# Patient Record
Sex: Female | Born: 1940 | Race: White | Hispanic: No | Marital: Married | Smoking: Former smoker
Health system: Southern US, Community
[De-identification: ages and names within clinical notes are randomized; demographics above are authoritative.]

## PROBLEM LIST (undated history)

## (undated) DIAGNOSIS — M549 Dorsalgia, unspecified: Secondary | ICD-10-CM

## (undated) DIAGNOSIS — L309 Dermatitis, unspecified: Secondary | ICD-10-CM

## (undated) DIAGNOSIS — M51379 Other intervertebral disc degeneration, lumbosacral region without mention of lumbar back pain or lower extremity pain: Secondary | ICD-10-CM

## (undated) DIAGNOSIS — M5137 Other intervertebral disc degeneration, lumbosacral region: Secondary | ICD-10-CM

## (undated) DIAGNOSIS — D696 Thrombocytopenia, unspecified: Secondary | ICD-10-CM

## (undated) DIAGNOSIS — J984 Other disorders of lung: Secondary | ICD-10-CM

## (undated) HISTORY — DX: Other disorders of lung: J98.4

## (undated) HISTORY — DX: Dermatitis, unspecified: L30.9

## (undated) HISTORY — DX: Dorsalgia, unspecified: M54.9

## (undated) HISTORY — DX: Other intervertebral disc degeneration, lumbosacral region: M51.37

## (undated) HISTORY — DX: Other intervertebral disc degeneration, lumbosacral region without mention of lumbar back pain or lower extremity pain: M51.379

## (undated) HISTORY — DX: Thrombocytopenia, unspecified: D69.6

---

## 1975-07-30 HISTORY — PX: TUBAL LIGATION: SHX77

## 1982-07-29 HISTORY — PX: ABDOMINAL HYSTERECTOMY: SHX81

## 1988-07-29 HISTORY — PX: OTHER SURGICAL HISTORY: SHX169

## 2000-04-12 ENCOUNTER — Emergency Department (HOSPITAL_COMMUNITY): Admission: EM | Admit: 2000-04-12 | Discharge: 2000-04-12 | Payer: Self-pay | Admitting: Emergency Medicine

## 2005-07-29 HISTORY — PX: CHOLECYSTECTOMY: SHX55

## 2007-11-18 ENCOUNTER — Telehealth (INDEPENDENT_AMBULATORY_CARE_PROVIDER_SITE_OTHER): Payer: Self-pay | Admitting: *Deleted

## 2007-11-18 ENCOUNTER — Encounter: Admission: RE | Admit: 2007-11-18 | Discharge: 2007-11-18 | Payer: Self-pay | Admitting: Family Medicine

## 2007-11-18 ENCOUNTER — Ambulatory Visit: Payer: Self-pay | Admitting: Family Medicine

## 2007-11-18 DIAGNOSIS — E785 Hyperlipidemia, unspecified: Secondary | ICD-10-CM

## 2007-11-18 DIAGNOSIS — J4489 Other specified chronic obstructive pulmonary disease: Secondary | ICD-10-CM | POA: Insufficient documentation

## 2007-11-18 DIAGNOSIS — M810 Age-related osteoporosis without current pathological fracture: Secondary | ICD-10-CM | POA: Insufficient documentation

## 2007-11-18 DIAGNOSIS — M549 Dorsalgia, unspecified: Secondary | ICD-10-CM | POA: Insufficient documentation

## 2007-11-18 DIAGNOSIS — I10 Essential (primary) hypertension: Secondary | ICD-10-CM | POA: Insufficient documentation

## 2007-11-18 DIAGNOSIS — J449 Chronic obstructive pulmonary disease, unspecified: Secondary | ICD-10-CM

## 2007-11-18 DIAGNOSIS — M5137 Other intervertebral disc degeneration, lumbosacral region: Secondary | ICD-10-CM | POA: Insufficient documentation

## 2007-11-19 LAB — CONVERTED CEMR LAB
Albumin: 4.4 g/dL (ref 3.5–5.2)
Alkaline Phosphatase: 77 units/L (ref 39–117)
CO2: 21 meq/L (ref 19–32)
Calcium: 9.9 mg/dL (ref 8.4–10.5)
Chloride: 106 meq/L (ref 96–112)
Glucose, Bld: 104 mg/dL — ABNORMAL HIGH (ref 70–99)
HCT: 53.9 % — ABNORMAL HIGH (ref 36.0–46.0)
Hemoglobin: 17.4 g/dL — ABNORMAL HIGH (ref 12.0–15.0)
Total Bilirubin: 0.5 mg/dL (ref 0.3–1.2)
Total Protein: 7.8 g/dL (ref 6.0–8.3)
VLDL: 45 mg/dL — ABNORMAL HIGH (ref 0–40)
WBC: 10 10*3/uL (ref 4.0–10.5)

## 2007-11-25 ENCOUNTER — Encounter: Payer: Self-pay | Admitting: Family Medicine

## 2007-12-23 ENCOUNTER — Ambulatory Visit: Payer: Self-pay | Admitting: Family Medicine

## 2007-12-23 DIAGNOSIS — D696 Thrombocytopenia, unspecified: Secondary | ICD-10-CM | POA: Insufficient documentation

## 2007-12-24 ENCOUNTER — Encounter: Admission: RE | Admit: 2007-12-24 | Discharge: 2007-12-24 | Payer: Self-pay | Admitting: Family Medicine

## 2007-12-25 ENCOUNTER — Encounter: Payer: Self-pay | Admitting: Family Medicine

## 2007-12-25 LAB — CONVERTED CEMR LAB
HCT: 51.4 % — ABNORMAL HIGH (ref 36.0–46.0)
RBC: 5.36 M/uL — ABNORMAL HIGH (ref 3.87–5.11)
RDW: 15.1 % (ref 11.5–15.5)
WBC: 7.8 10*3/uL (ref 4.0–10.5)

## 2008-01-22 ENCOUNTER — Ambulatory Visit: Payer: Self-pay | Admitting: Family Medicine

## 2008-01-25 LAB — CONVERTED CEMR LAB
ALT: 11 units/L (ref 0–35)
AST: 19 units/L (ref 0–37)
Alkaline Phosphatase: 66 units/L (ref 39–117)
CO2: 23 meq/L (ref 19–32)
HDL: 43 mg/dL (ref >39)
Potassium: 4.2 meq/L (ref 3.5–5.3)
Sodium: 142 meq/L (ref 135–145)
Total CHOL/HDL Ratio: 4.7
Triglycerides: 193 mg/dL — ABNORMAL HIGH (ref <150)

## 2008-03-23 ENCOUNTER — Ambulatory Visit: Payer: Self-pay | Admitting: Family Medicine

## 2008-03-23 DIAGNOSIS — L259 Unspecified contact dermatitis, unspecified cause: Secondary | ICD-10-CM

## 2008-05-04 ENCOUNTER — Ambulatory Visit: Payer: Self-pay | Admitting: Family Medicine

## 2008-05-04 ENCOUNTER — Encounter: Admission: RE | Admit: 2008-05-04 | Discharge: 2008-05-04 | Payer: Self-pay | Admitting: Family Medicine

## 2008-05-04 DIAGNOSIS — R05 Cough: Secondary | ICD-10-CM | POA: Insufficient documentation

## 2008-05-05 LAB — CONVERTED CEMR LAB
Cholesterol: 137 mg/dL (ref 0–200)
LDL Cholesterol: 57 mg/dL (ref 0–99)
Triglycerides: 168 mg/dL — ABNORMAL HIGH (ref <150)

## 2008-07-04 ENCOUNTER — Ambulatory Visit: Payer: Self-pay | Admitting: Family Medicine

## 2008-09-05 ENCOUNTER — Ambulatory Visit: Payer: Self-pay | Admitting: Family Medicine

## 2008-12-06 ENCOUNTER — Ambulatory Visit: Payer: Self-pay | Admitting: Family Medicine

## 2008-12-07 LAB — CONVERTED CEMR LAB
ALT: 12 units/L (ref 0–35)
Albumin: 4.1 g/dL (ref 3.5–5.2)
BUN: 20 mg/dL (ref 6–23)
CO2: 22 meq/L (ref 19–32)
Calcium: 9.9 mg/dL (ref 8.4–10.5)
HDL: 49 mg/dL (ref >39)
LDL Cholesterol: 87 mg/dL (ref 0–99)
Sodium: 142 meq/L (ref 135–145)
Total CHOL/HDL Ratio: 3.6
Total Protein: 7 g/dL (ref 6.0–8.3)
VLDL: 41 mg/dL — ABNORMAL HIGH (ref 0–40)

## 2008-12-09 ENCOUNTER — Ambulatory Visit: Payer: Self-pay | Admitting: Family Medicine

## 2008-12-19 ENCOUNTER — Encounter: Admission: RE | Admit: 2008-12-19 | Discharge: 2008-12-19 | Payer: Self-pay | Admitting: Orthopedic Surgery

## 2008-12-24 ENCOUNTER — Encounter: Admission: RE | Admit: 2008-12-24 | Discharge: 2008-12-24 | Payer: Self-pay | Admitting: Orthopedic Surgery

## 2008-12-27 ENCOUNTER — Encounter: Admission: RE | Admit: 2008-12-27 | Discharge: 2008-12-27 | Payer: Self-pay | Admitting: Family Medicine

## 2009-02-07 ENCOUNTER — Telehealth: Payer: Self-pay | Admitting: Family Medicine

## 2009-02-14 ENCOUNTER — Telehealth: Payer: Self-pay | Admitting: Family Medicine

## 2009-03-03 ENCOUNTER — Ambulatory Visit: Payer: Self-pay | Admitting: Family Medicine

## 2009-03-29 ENCOUNTER — Inpatient Hospital Stay (HOSPITAL_COMMUNITY): Admission: RE | Admit: 2009-03-29 | Discharge: 2009-03-31 | Payer: Self-pay | Admitting: Orthopedic Surgery

## 2009-03-29 HISTORY — PX: BACK SURGERY: SHX140

## 2009-05-16 ENCOUNTER — Ambulatory Visit: Payer: Self-pay | Admitting: Family Medicine

## 2009-05-16 DIAGNOSIS — I872 Venous insufficiency (chronic) (peripheral): Secondary | ICD-10-CM | POA: Insufficient documentation

## 2009-05-17 ENCOUNTER — Encounter: Payer: Self-pay | Admitting: Family Medicine

## 2009-05-17 DIAGNOSIS — R944 Abnormal results of kidney function studies: Secondary | ICD-10-CM | POA: Insufficient documentation

## 2009-05-17 DIAGNOSIS — N183 Chronic kidney disease, stage 3 (moderate): Secondary | ICD-10-CM

## 2009-05-17 LAB — CONVERTED CEMR LAB
BUN: 20 mg/dL (ref 6–23)
Calcium: 9.7 mg/dL (ref 8.4–10.5)
Folate: 6 ng/mL
MCV: 100.2 fL — ABNORMAL HIGH (ref 78.0–100.0)
Potassium: 4.4 meq/L (ref 3.5–5.3)

## 2009-05-18 ENCOUNTER — Encounter: Payer: Self-pay | Admitting: Family Medicine

## 2009-05-18 ENCOUNTER — Encounter: Admission: RE | Admit: 2009-05-18 | Discharge: 2009-05-18 | Payer: Self-pay | Admitting: Family Medicine

## 2009-05-24 ENCOUNTER — Ambulatory Visit: Payer: Self-pay | Admitting: Vascular Surgery

## 2009-05-24 ENCOUNTER — Ambulatory Visit (HOSPITAL_COMMUNITY): Admission: RE | Admit: 2009-05-24 | Discharge: 2009-05-24 | Payer: Self-pay | Admitting: Family Medicine

## 2009-05-24 ENCOUNTER — Encounter: Payer: Self-pay | Admitting: Family Medicine

## 2009-05-26 ENCOUNTER — Encounter: Payer: Self-pay | Admitting: Family Medicine

## 2009-06-19 ENCOUNTER — Encounter: Admission: RE | Admit: 2009-06-19 | Discharge: 2009-06-19 | Payer: Self-pay | Admitting: Orthopedic Surgery

## 2009-06-27 ENCOUNTER — Ambulatory Visit: Payer: Self-pay | Admitting: Vascular Surgery

## 2009-06-27 ENCOUNTER — Encounter: Payer: Self-pay | Admitting: Family Medicine

## 2009-06-28 ENCOUNTER — Encounter: Admission: RE | Admit: 2009-06-28 | Discharge: 2009-07-27 | Payer: Self-pay | Admitting: Orthopedic Surgery

## 2009-07-12 ENCOUNTER — Ambulatory Visit: Payer: Self-pay | Admitting: Family Medicine

## 2009-08-16 ENCOUNTER — Ambulatory Visit: Payer: Self-pay | Admitting: Family Medicine

## 2009-08-28 ENCOUNTER — Encounter: Admission: RE | Admit: 2009-08-28 | Discharge: 2009-08-28 | Payer: Self-pay | Admitting: Orthopedic Surgery

## 2009-09-05 ENCOUNTER — Ambulatory Visit: Payer: Self-pay | Admitting: Family Medicine

## 2009-09-05 DIAGNOSIS — B029 Zoster without complications: Secondary | ICD-10-CM | POA: Insufficient documentation

## 2009-09-06 ENCOUNTER — Encounter: Payer: Self-pay | Admitting: Family Medicine

## 2009-09-08 ENCOUNTER — Encounter: Payer: Self-pay | Admitting: Family Medicine

## 2009-10-03 ENCOUNTER — Ambulatory Visit: Payer: Self-pay | Admitting: Family Medicine

## 2009-10-03 DIAGNOSIS — G4700 Insomnia, unspecified: Secondary | ICD-10-CM | POA: Insufficient documentation

## 2009-11-20 ENCOUNTER — Encounter: Admission: RE | Admit: 2009-11-20 | Discharge: 2009-11-20 | Payer: Self-pay | Admitting: Orthopedic Surgery

## 2010-02-10 IMAGING — CR DG LUMBAR SPINE 2-3V
2 series · 2 of 2 positions shown · non-contrast
Comparison: Intraoperative imaging of 1 day prior. preoperative
imaging of 03/27/2009.

CLINICAL DATA: Postop pain.  Postoperative  L5-S1 fixation.

LUMBAR SPINE - 2-3 VIEW

[t l-spine a.p.]
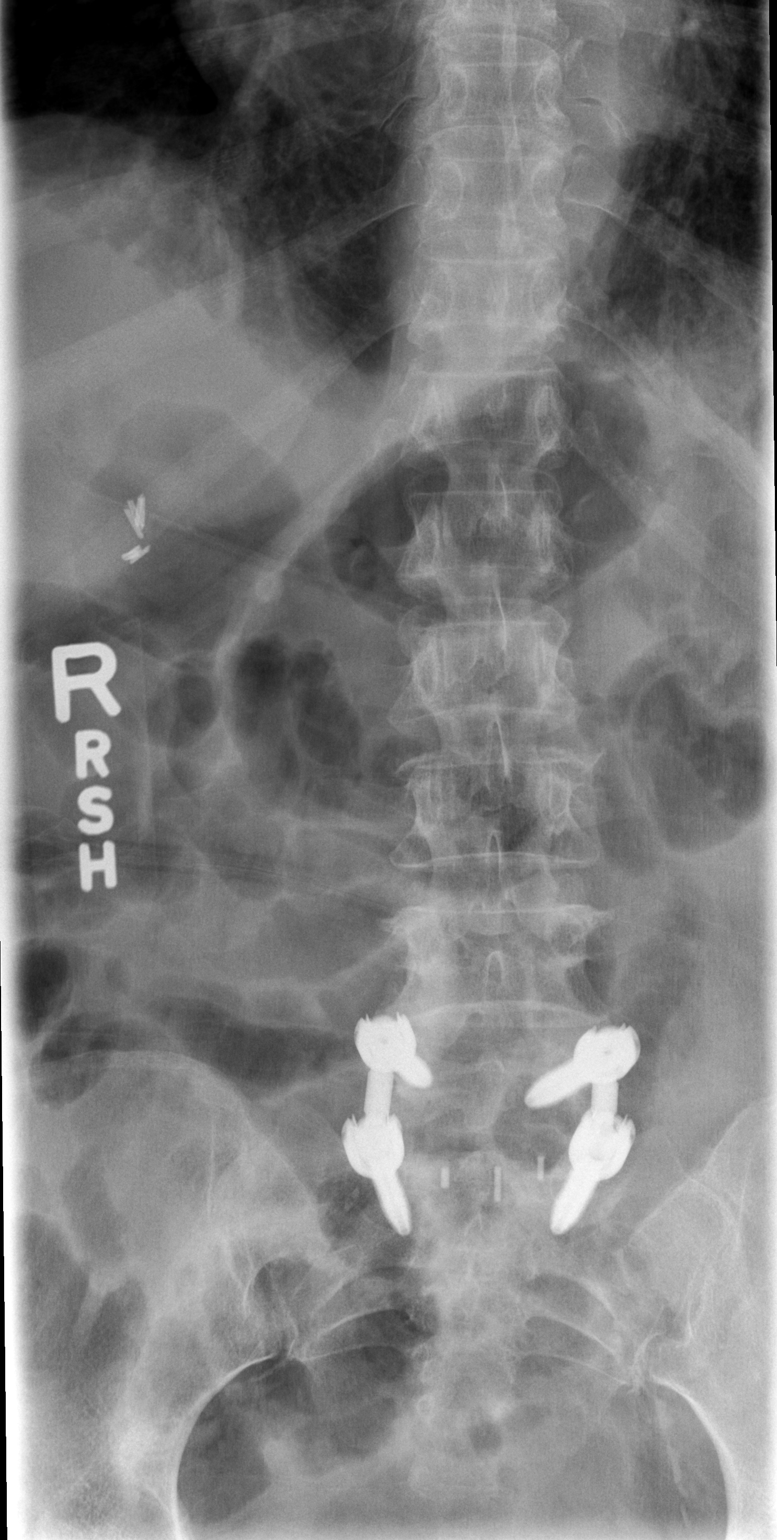

[t l-spine lat]
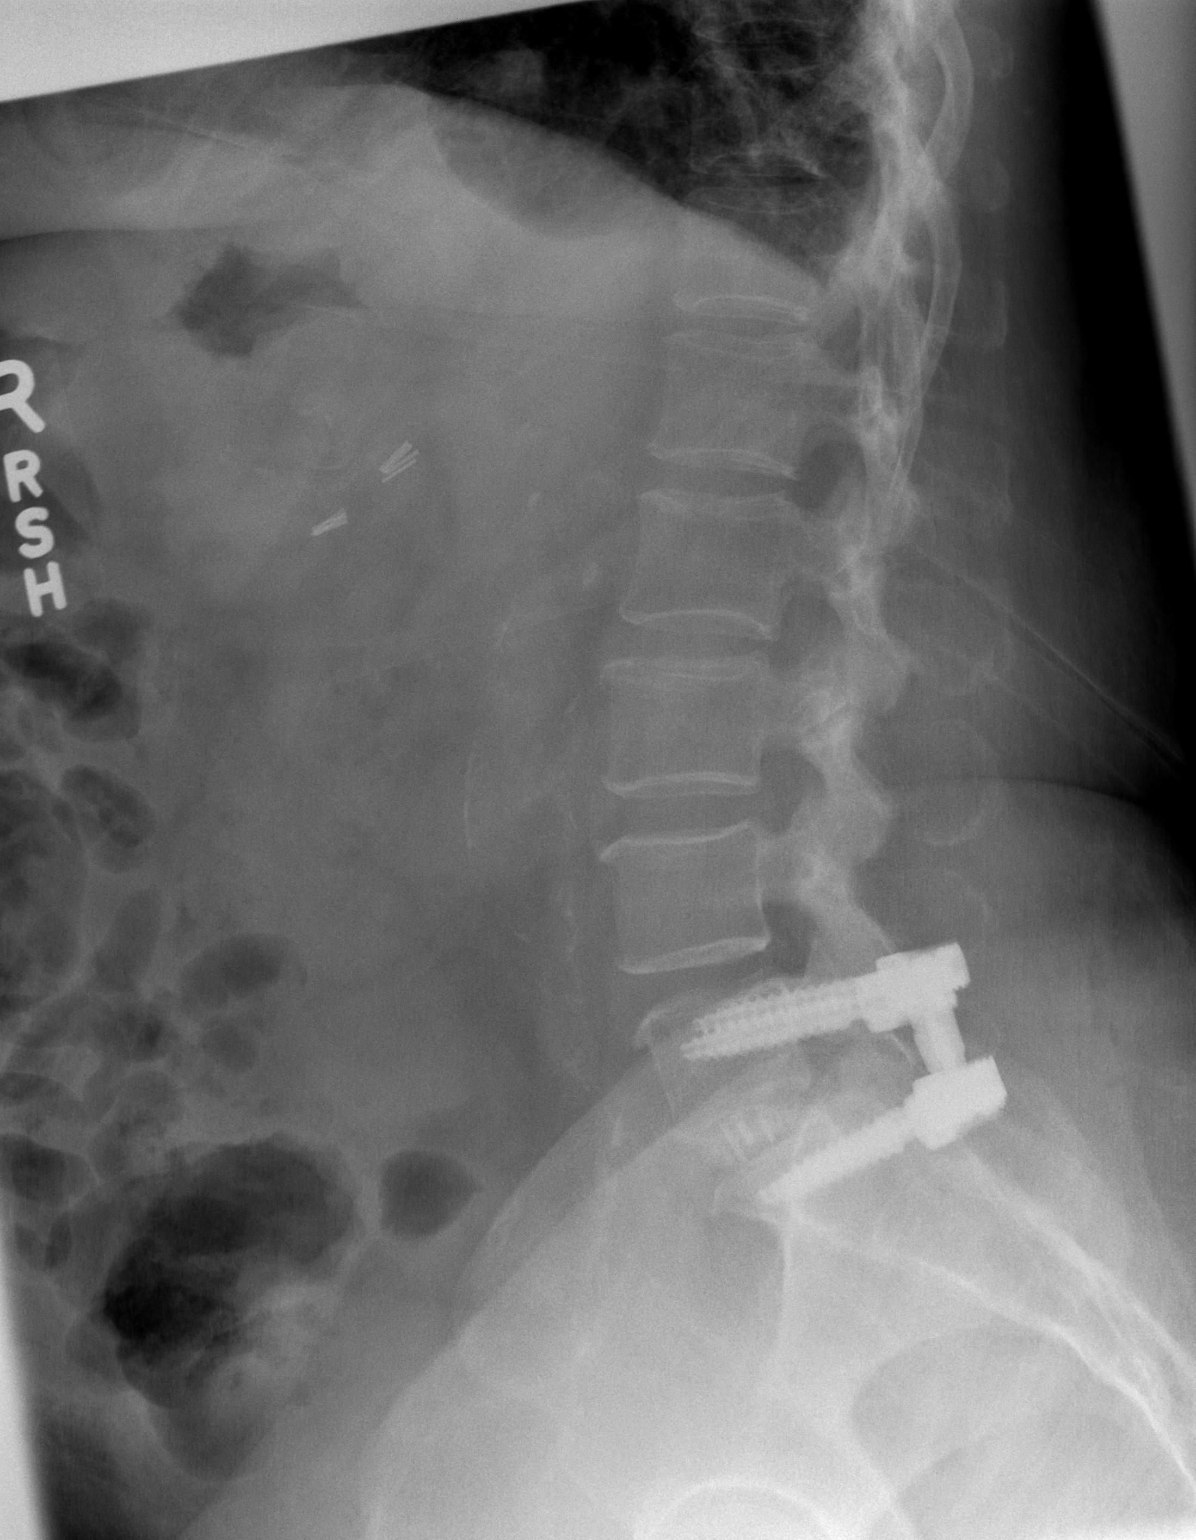

[2 of 2 positions shown; findings below may reference images not displayed]

FINDINGS: Five lumbar-type vertebral bodies.  Mildly prominent gas-
filled small bowel loops are suboptimally imaged.

The patient is status post trans pedicle rod and screw fixation at
L5-S1.  No acute hardware complication identified.

Remainder of vertebral body height is maintained.  Mild osteopenia.
Moderate aortic atherosclerosis incidentally noted.
IMPRESSION: 1.  Status post L5-S1 posterior fixation without acute
complication.
2.  Prominent gas-filled small bowel loops.  Question adynamic
ileus.  Suboptimally evaluated.

## 2010-04-09 ENCOUNTER — Encounter: Admission: RE | Admit: 2010-04-09 | Discharge: 2010-04-09 | Payer: Self-pay | Admitting: Orthopedic Surgery

## 2010-04-09 ENCOUNTER — Ambulatory Visit: Payer: Self-pay | Admitting: Family Medicine

## 2010-04-10 LAB — CONVERTED CEMR LAB
ALT: 10 units/L (ref 0–35)
AST: 11 units/L (ref 0–37)
Chloride: 104 meq/L (ref 96–112)
Creatinine, Ser: 1.07 mg/dL (ref 0.40–1.20)
Sodium: 142 meq/L (ref 135–145)
Total Bilirubin: 0.7 mg/dL (ref 0.3–1.2)
Total CHOL/HDL Ratio: 3.2
VLDL: 20 mg/dL (ref 0–40)

## 2010-04-25 ENCOUNTER — Encounter: Admission: RE | Admit: 2010-04-25 | Discharge: 2010-04-25 | Payer: Self-pay | Admitting: Orthopedic Surgery

## 2010-04-30 ENCOUNTER — Ambulatory Visit: Payer: Self-pay | Admitting: Family Medicine

## 2010-04-30 ENCOUNTER — Telehealth (INDEPENDENT_AMBULATORY_CARE_PROVIDER_SITE_OTHER): Payer: Self-pay | Admitting: *Deleted

## 2010-04-30 ENCOUNTER — Telehealth: Payer: Self-pay | Admitting: Family Medicine

## 2010-04-30 DIAGNOSIS — E119 Type 2 diabetes mellitus without complications: Secondary | ICD-10-CM

## 2010-04-30 LAB — CONVERTED CEMR LAB
Blood Glucose, AC Bkfst: 122 mg/dL
Hgb A1c MFr Bld: 6.5 %

## 2010-05-02 IMAGING — CR DG LUMBAR SPINE 2-3V
4 series · 4 of 4 positions shown · non-contrast
Comparison: 03/30/2009

CLINICAL DATA: Post lumbar fusion March 2009

LUMBAR SPINE - 2-3 VIEW

[view not recorded (1 of 4)]
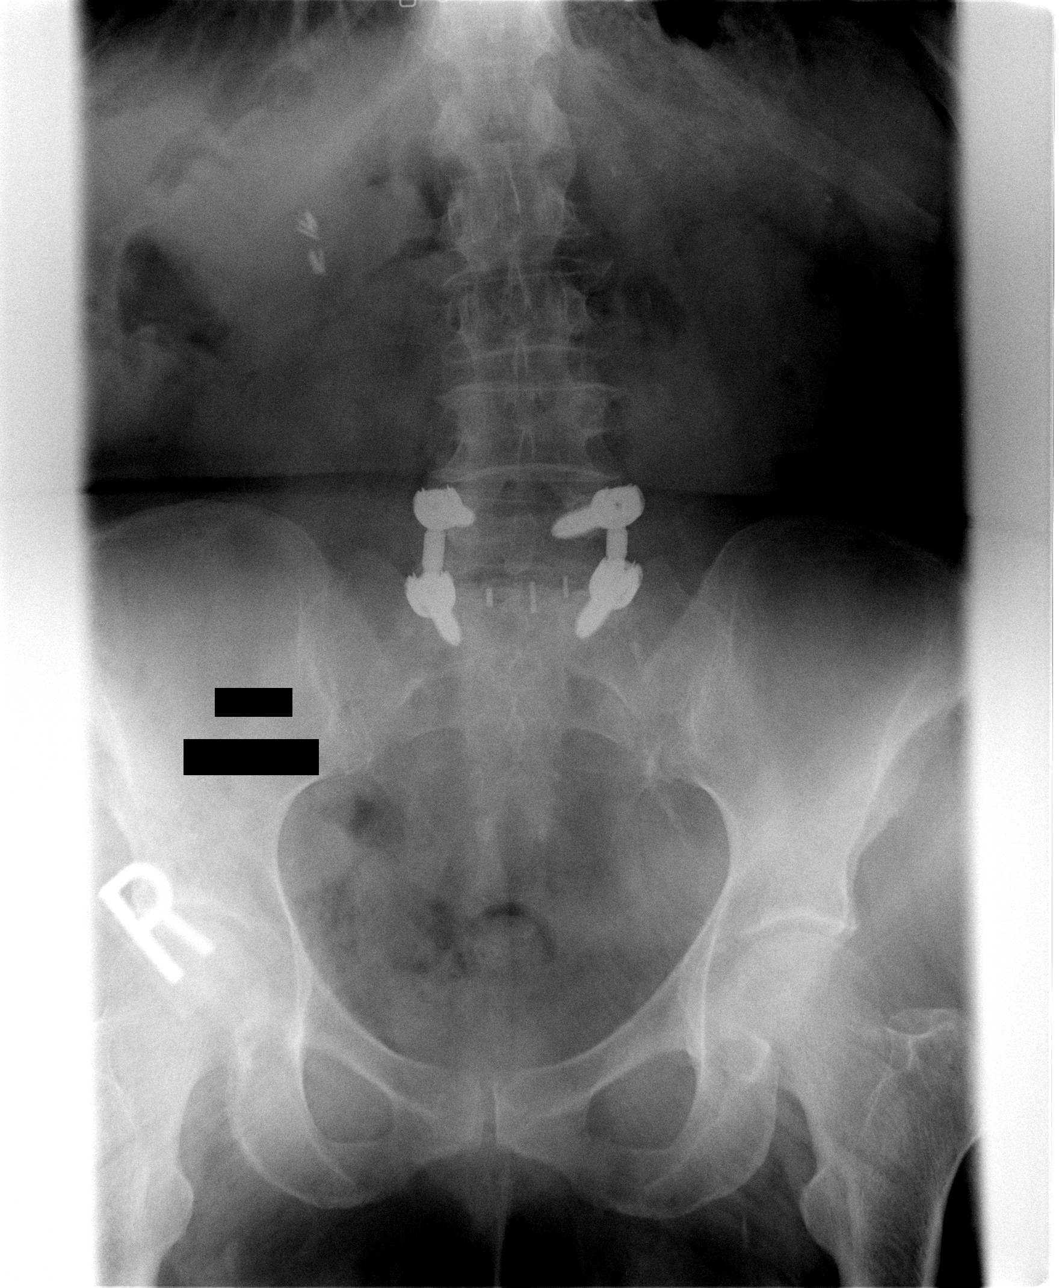

[view not recorded (2 of 4)]
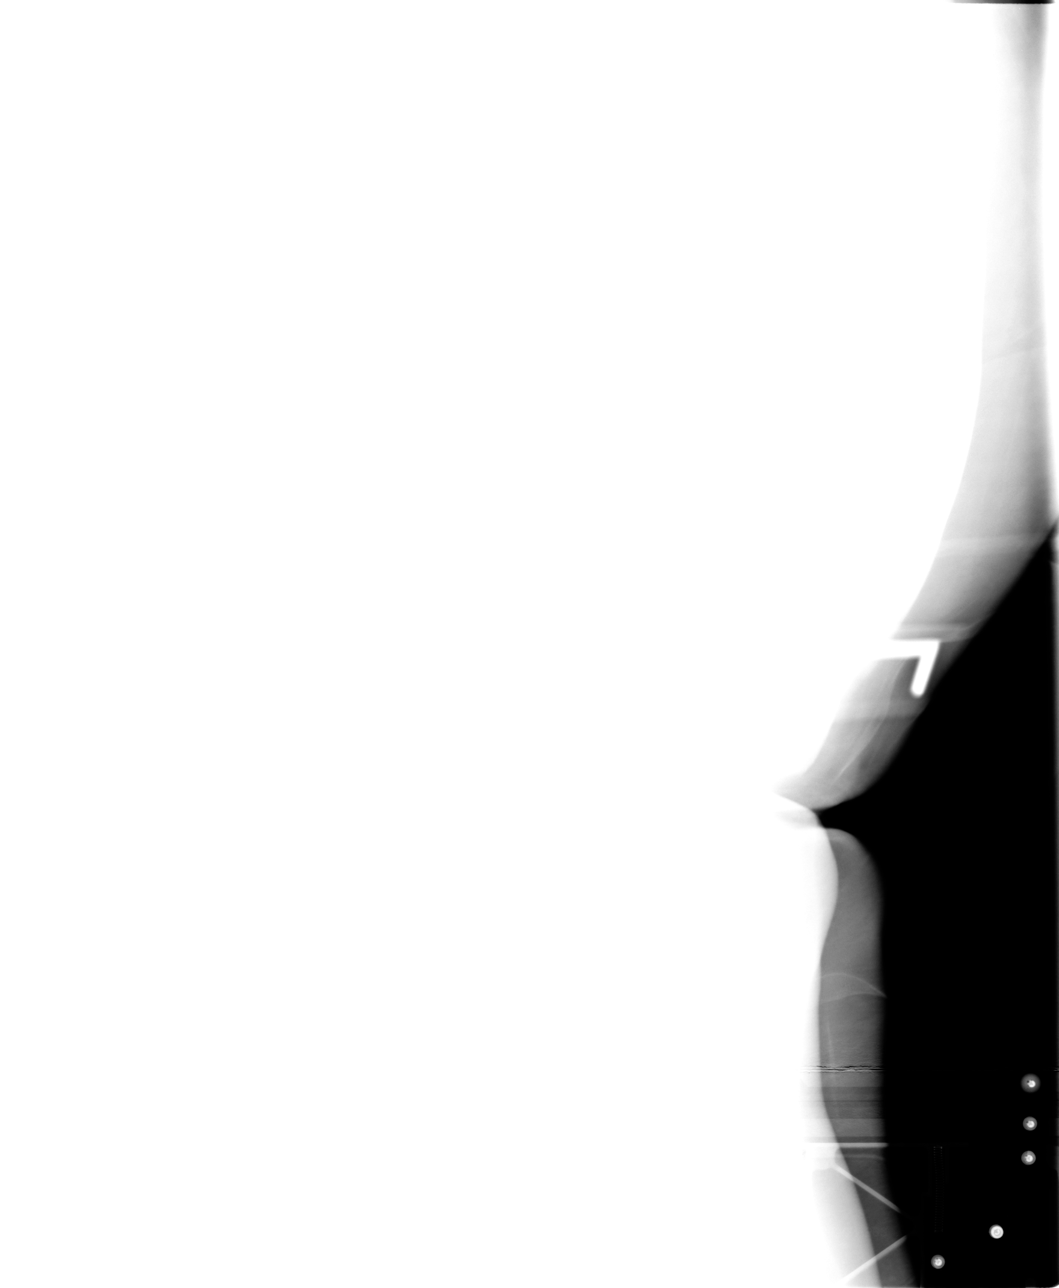

[view not recorded (3 of 4)]
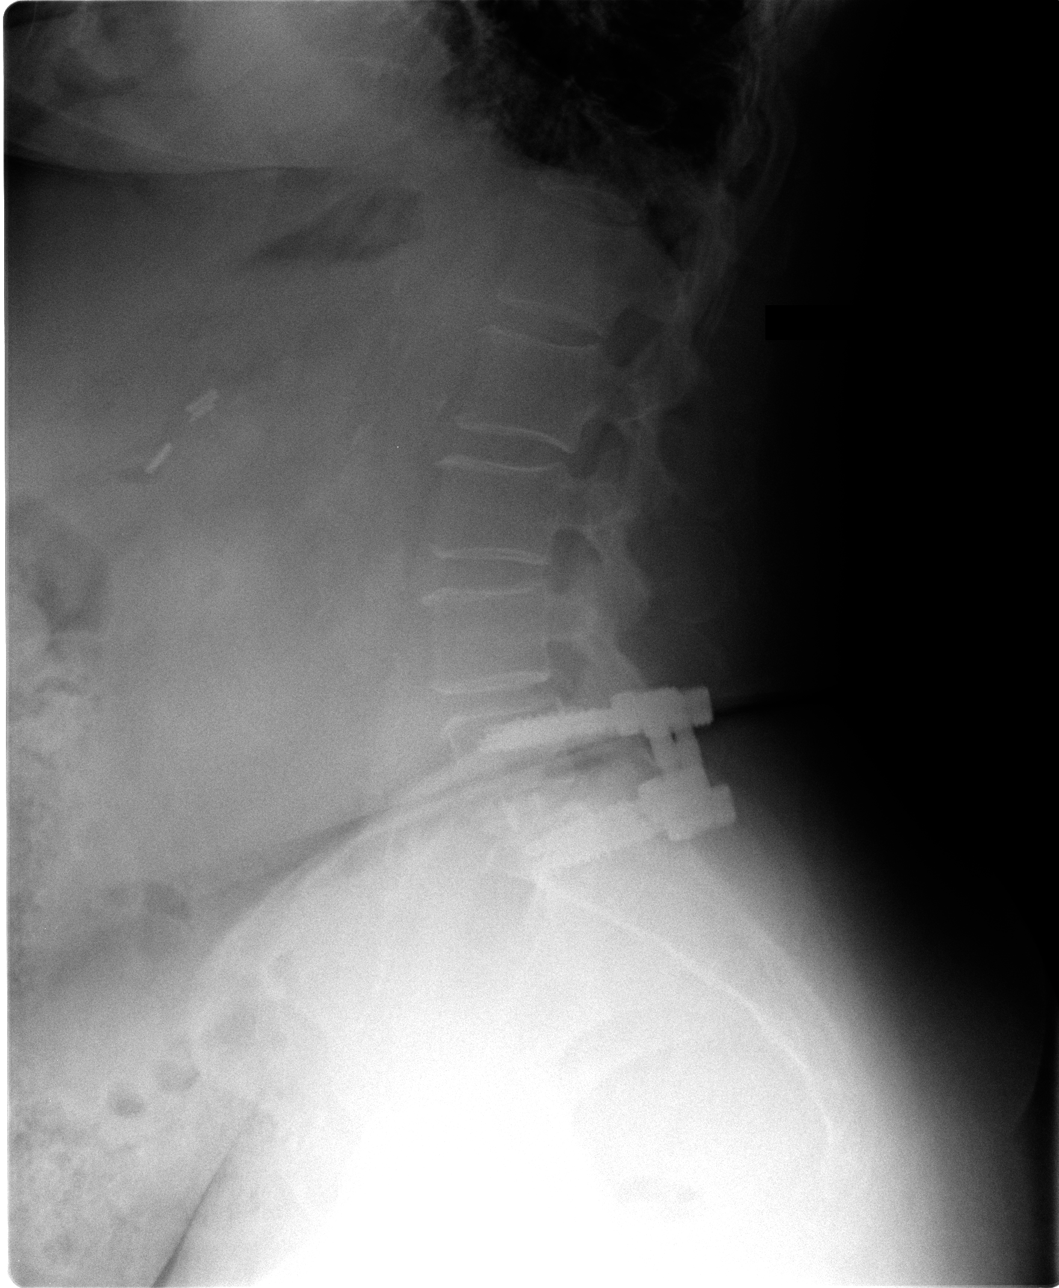

[view not recorded (4 of 4)]
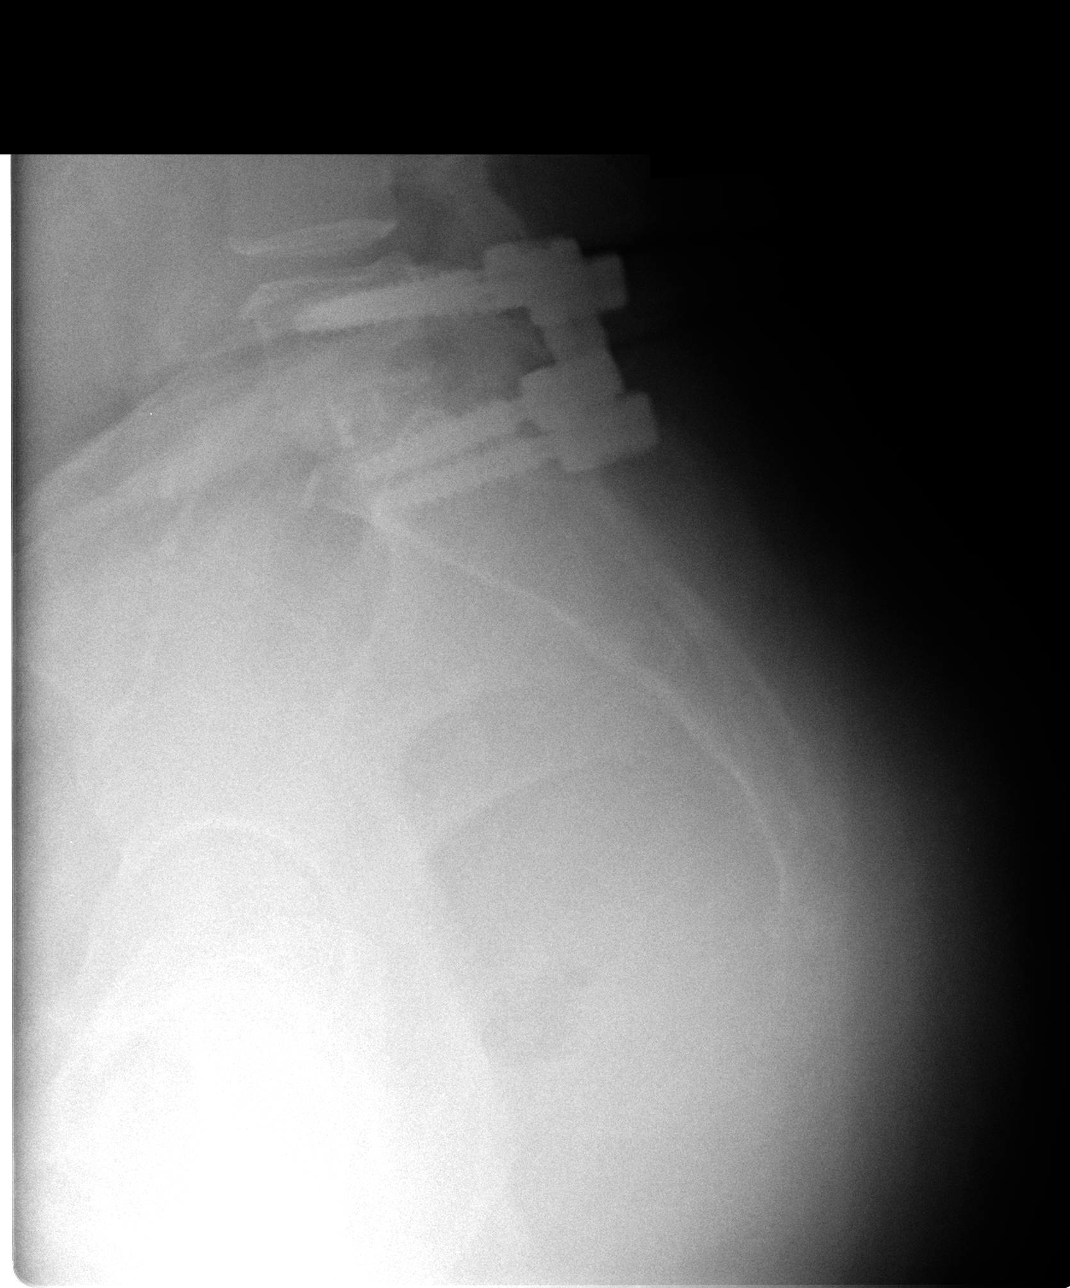

[4 of 4 positions shown; findings below may reference images not displayed]

FINDINGS: Status post trans pedicle rod and screw fixation with
discectomy at L5-S1.  No change in position or alignment of bony
elements and hardware.  Other vertebral bodies and disc space
height remain normal.
IMPRESSION: Good position alignment following L5-S1 fusion with discectomy.  No
acute findings or significant interval change.

## 2010-05-21 ENCOUNTER — Telehealth (INDEPENDENT_AMBULATORY_CARE_PROVIDER_SITE_OTHER): Payer: Self-pay | Admitting: *Deleted

## 2010-06-04 ENCOUNTER — Ambulatory Visit: Payer: Self-pay | Admitting: Family Medicine

## 2010-08-10 ENCOUNTER — Ambulatory Visit: Admit: 2010-08-10 | Payer: Self-pay | Admitting: Family Medicine

## 2010-08-26 LAB — CONVERTED CEMR LAB
AST: 17 units/L (ref 0–37)
Albumin: 4.1 g/dL (ref 3.5–5.2)
BUN: 24 mg/dL — ABNORMAL HIGH (ref 6–23)
Calcium, Total (PTH): 9 mg/dL (ref 8.4–10.5)
Calcium: 9.3 mg/dL (ref 8.4–10.5)
Cholesterol, target level: 200 mg/dL
Creatinine, Ser: 1.07 mg/dL (ref 0.40–1.20)
Creatinine, Ser: 1.13 mg/dL (ref 0.40–1.20)
Creatinine, Urine: 84.3 mg/dL
Glucose, Bld: 113 mg/dL — ABNORMAL HIGH (ref 70–99)
HCT: 48.6 % — ABNORMAL HIGH (ref 36.0–46.0)
HDL goal, serum: 40 mg/dL
Microalb Creat Ratio: 34.3 mg/g — ABNORMAL HIGH (ref 0.0–30.0)
Microalb Creat Ratio: 514.5 mg/g — ABNORMAL HIGH (ref 0.0–30.0)
Microalb, Ur: 2.89 mg/dL — ABNORMAL HIGH (ref 0.00–1.89)
PTH: 103.3 pg/mL — ABNORMAL HIGH (ref 14.0–72.0)
Platelets: 149 10*3/uL — ABNORMAL LOW (ref 150–400)
Sodium: 141 meq/L (ref 135–145)
Total Bilirubin: 0.6 mg/dL (ref 0.3–1.2)
Total Protein: 6.8 g/dL (ref 6.0–8.3)
WBC: 10.3 10*3/uL (ref 4.0–10.5)

## 2010-08-28 NOTE — Assessment & Plan Note (Signed)
Summary: New dx DM, HTN   Vital Signs:  Patient profile:   70 year old female Height:      63.1 inches Weight:      203 pounds Pulse rate:   88 / minute BP sitting:   145 / 83  (right arm) Cuff size:   large  Vitals Entered By: Avon Gully CMA, Duncan Dull) (April 30, 2010 9:16 AM) CC: discuss elevated BS   CC:  discuss elevated BS.  Current Medications (verified): 1)  Lisinopril-Hydrochlorothiazide 20-25 Mg  Tabs (Lisinopril-Hydrochlorothiazide) .... Take 1 Tablet By Mouth Once A Day 2)  Simvastatin 40 Mg  Tabs (Simvastatin) .... Take 1 Tablet By Mouth Once A Day At Bedtime 3)  Oxybutynin Chloride 5 Mg  Tb24 (Oxybutynin Chloride) .... Take 1 Tablet By Mouth Once A Day 4)  Proair Hfa 108 (90 Base) Mcg/act Aers (Albuterol Sulfate) .... 2 - 4 Puffs Inhaled Every 4-6 Hours As Needed Sob, Wheeze. 5)  Alendronate Sodium 70 Mg  Tabs (Alendronate Sodium) .... Take 1 Tablet By Mouth Once A Day 6)  Spiriva Handihaler 18 Mcg  Caps (Tiotropium Bromide Monohydrate) .... Inhale One Capsule Once Daily 7)  Bayer Childrens Aspirin 81 Mg Chew (Aspirin) .... Take 1 Tablet By Mouth Once A Day 8)  Advair Diskus 250-50 Mcg/dose Misc (Fluticasone-Salmeterol) .Marland Kitchen.. 1 Puff Two Times A Day Inhaled 9)  Fish Oil 1000 Mg Caps (Omega-3 Fatty Acids) .... Take 1 Tablet By Mouth Once A Day  Allergies (verified): No Known Drug Allergies  Comments:  Nurse/Medical Assistant: The patient's medications and allergies were reviewed with the patient and were updated in the Medication and Allergy Lists. Avon Gully CMA, Duncan Dull) (April 30, 2010 9:17 AM)  Family History: Reviewed history from 12/09/2008 and no changes required. FAther died CHF Brother with DM Multiple sibling wiht DM.    Social History: Reviewed history from 03/03/2009 and no changes required. Homemaker.  HS degree.   Married to [employer] (MI x 2 , stroke, DM)  with 3 adult children.   Current Smoker- 3/4 ppd, down from 1ppd Alcohol  use-no Drug use-no Regular exercise-no  Physical Exam  General:  Well-developed,well-nourished,in no acute distress; alert,appropriate and cooperative throughout examination Head:  Normocephalic and atraumatic without obvious abnormalities. No apparent alopecia or balding. Neck:  No deformities, masses, or tenderness noted. NO tM.  Lungs:  Normal respiratory effort, chest expands symmetrically. Lungs are clear to auscultation, no crackles or wheezes. Heart:  Normal rate and regular rhythm. S1 and S2 normal without gallop, murmur, click, rub or other extra sounds. No carotid bruits.  Pulses:  Radial 2+ bilat.  Neurologic:  No cranial nerve deficits noted. Station and gait are normal. Plantar reflexes are down-going bilaterally. DTRs are symmetrical throughout. Sensory, motor and coordinative functions appear intact. Skin:  no rashes.   Cervical Nodes:  No lymphadenopathy noted Psych:  Cognition and judgment appear intact. Alert and cooperative with normal attention span and concentration. No apparent delusions, illusions, hallucinations   Impression & Recommendations:  Problem # 1:  DIABETES MELLITUS, CONTROLLED (ICD-250.00) Assessment New  Dsicussed new dx. A1C is 6.5.  Will owrk on diet, and weight loss. She has chronic back pain ans has difficulty exercising to discussede working out her upper body and this will help. Given rx for meter and strips. CHeck sugar 2 x a week fasting Given 4 H.O on diet and exercise and diabetes.  Will refer for nutritition classes F/U in one month.She has had her flu vaccine.  Her updated medication list for this problem includes:    Lisinopril-hydrochlorothiazide 20-25 Mg Tabs (Lisinopril-hydrochlorothiazide) .Marland Kitchen... Take 1 tablet by mouth once a day    Bayer Childrens Aspirin 81 Mg Chew (Aspirin) .Marland Kitchen... Take 1 tablet by mouth once a day  Orders: Fingerstick ([address]109) Glucose, (CBG) (82962) Hgb A1C (60454UJ) Nutrition Referral (Nutrition) Prescription  Created Electronically [phone])  Problem # 2:  ESSENTIAL HYPERTENSION, BENIGN (ICD-401.1) Assessment: Improved  Improved but not at goal. Has lost a few pounds. Continue to work on weight loss.  Will add a low dose betablocker for better control.   The following medications were removed from the medication list:    Furosemide 40 Mg Tabs (Furosemide) .Marland Kitchen... Take 1 tablet by mouth once a day as needed for swelling Her updated medication list for this problem includes:    Lisinopril-hydrochlorothiazide 20-25 Mg Tabs (Lisinopril-hydrochlorothiazide) .Marland Kitchen... Take 1 tablet by mouth once a day    Toprol Xl 25 Mg Xr24h-tab (Metoprolol succinate) .Marland Kitchen... Take 1 tablet by mouth once a day  BP today: 145/83 Prior BP: 157/86 (04/09/2010)  Prior 10 Yr Risk Heart Disease: 15 % (04/09/2010)  Labs Reviewed: K+: 5.0 (04/09/2010) Creat: : 1.07 (04/09/2010)   Chol: 167 (04/09/2010)   HDL: 53 (04/09/2010)   LDL: 94 (04/09/2010)   TG: 102 (04/09/2010)  Complete Medication List: 1)  Lisinopril-hydrochlorothiazide 20-25 Mg Tabs (Lisinopril-hydrochlorothiazide) .... Take 1 tablet by mouth once a day 2)  Simvastatin 40 Mg Tabs (Simvastatin) .... Take 1 tablet by mouth once a day at bedtime 3)  Oxybutynin Chloride 5 Mg Tb24 (Oxybutynin chloride) .... Take 1 tablet by mouth once a day 4)  Proair Hfa 108 (90 Base) Mcg/act Aers (Albuterol sulfate) .... 2 - 4 puffs inhaled every 4-6 hours as needed sob, wheeze. 5)  Alendronate Sodium 70 Mg Tabs (Alendronate sodium) .... Take 1 tablet by mouth once a day 6)  Spiriva Handihaler 18 Mcg Caps (Tiotropium bromide monohydrate) .... Inhale one capsule once daily 7)  Bayer Childrens Aspirin 81 Mg Chew (Aspirin) .... Take 1 tablet by mouth once a day 8)  Advair Diskus 250-50 Mcg/dose Misc (Fluticasone-salmeterol) .Marland Kitchen.. 1 puff two times a day inhaled 9)  Fish Oil 1000 Mg Caps (Omega-3 fatty acids) .... Take 1 tablet by mouth once a day 10)  Toprol Xl 25 Mg Xr24h-tab (Metoprolol  succinate) .... Take 1 tablet by mouth once a day 11)  Glucometer  .... Test dialy. stips and lancets for 90 days supply dx 250.00  Patient Instructions: 1)  Check sugars in the AM before breakfast 2 days a week and follow up in one month to go over sugars and adjust care plan.  Prescriptions: GLUCOMETER TEst dialy. STips and lancets for 90 days supply Dx 250.00  #90 day sup x 1   Entered and Authorized by:   [name]   Signed by:   [name] on 04/30/2010   Method used:   Printed then faxed to ...       [facility] (retail)       [address]       [place], [place]  [address]       Ph: [phone]       Fax: [phone]   RxID:   [id number] TOPROL XL 25 MG XR24H-TAB (METOPROLOL SUCCINATE) Take 1 tablet by mouth once a day  #30 x 2   Entered and Authorized by:   [name]   Signed by:   [name] on 04/30/2010  Method used:   Electronically to        [facility] (retail)       [address]       [place], [place]  [address]       Ph: [phone]       Fax: [phone]   RxID:   [id number]   Laboratory Results   Blood Tests   Date/Time Received: 04/30/10 Date/Time Reported: 04/30/10  HGBA1C: 6.5%   (Normal Range: Non-Diabetic - 3-6%   Control Diabetic - 6-8%) CBG Fasting:: 122mg /dL

## 2010-08-28 NOTE — Progress Notes (Signed)
----   Converted from flag ---- ---- 04/30/2010 11:59 AM, [name] wrote: Call pt: Let he know I did send over a new rx for BP so now on 2 tabs for BP. ------------------------------ 04/30/10 12:56 called pt and notified her of above instructions. Pt voiced understanding. acm

## 2010-08-28 NOTE — Assessment & Plan Note (Signed)
Summary: shingles   Vital Signs:  Patient profile:   70 year old female Height:      63.1 inches Weight:      197 pounds BMI:     34.91 Pulse rate:   103 / minute BP sitting:   149 / 86  Vitals Entered By: Kandice Hams (September 05, 2009 10:04 AM) CC: C/O RASH ON BACK   Primary Care Provider:  Linford Arnold, C  CC:  C/O RASH ON BACK.  History of Present Illness: Rash started last week, ithcy, burns and painful.  No creams or medication.  Getting worse this week. STarted out as a small spot.  No fever or URI sxs. No worsenign or alleviating sxs. Has woken her up the last 2 nights. Think has shingles years ago on her shoulder.   Allergies: No Known Drug Allergies  Physical Exam  General:  Well-developed,well-nourished,in no acute distress; alert,appropriate and cooperative throughout examination Skin:  RAsh on the left midf flank that wraps from teh spine to the naval.  Lesions are vessicular and purples on an erythematous base. Lesoin on her side are clear vesicles nad this is where the culture was taken.    Impression & Recommendations:  Problem # 1:  SHINGLES (ICD-053.9) Discussed dx. Culture done to confrim. Will treat with famciclovir and topical lidocaine gel.  She already has some hydrocodone at home rx by another physican and can use this abedtime if pain is keeping her awake. Avoid scratching to avoid infection.  Orders: T-Herpes Culture (Routine) ([id number])  Complete Medication List: 1)  Lisinopril-hydrochlorothiazide 20-25 Mg Tabs (Lisinopril-hydrochlorothiazide) .... Take 1 tablet by mouth once a day 2)  Simvastatin 40 Mg Tabs (Simvastatin) .... Take 1 tablet by mouth once a day at bedtime 3)  Oxybutynin Chloride 5 Mg Tb24 (Oxybutynin chloride) .... Take 1 tablet by mouth once a day 4)  Proair Hfa 108 (90 Base) Mcg/act Aers (Albuterol sulfate) .... 2 - 4 puffs inhaled every 4-6 hours as needed sob, wheeze. 5)  Alendronate Sodium 70 Mg Tabs (Alendronate sodium) ....  Take 1 tablet by mouth once a day 6)  Spiriva Handihaler 18 Mcg Caps (Tiotropium bromide monohydrate) .... Inhale one capsule once daily 7)  Bayer Childrens Aspirin 81 Mg Chew (Aspirin) .... Take 1 tablet by mouth once a day 8)  Advair Diskus 250-50 Mcg/dose Misc (Fluticasone-salmeterol) .Marland Kitchen.. 1 puff two times a day inhaled 9)  Fish Oil 1000 Mg Caps (Omega-3 fatty acids) .... Take 1 tablet by mouth once a day 10)  Furosemide 40 Mg Tabs (Furosemide) .... Take 1 tablet by mouth once a day as needed for swelling 11)  Medium Grade Below The Knee Compression Stockings.  .... Please measure  and fit for the right and leg left. 12)  Lidocaine Hcl 2 % Gel (Lidocaine hcl) .... Apply four times a day as needed for rash pain. 13)  Famciclovir 500 Mg Tabs (Famciclovir) .... Take 1 tablet by mouth three times a day for 7 days  Patient Instructions: 1)  Please schedule a follow-up appointment in 1 month to recheck Blood pressure.  Prescriptions: ADVAIR DISKUS 250-50 MCG/DOSE MISC (FLUTICASONE-SALMETEROL) 1 puff two times a day inhaled  #1 x 5   Entered and Authorized by:   [name]   Signed by:   [name] on 09/05/2009   Method used:   Electronically to        [facility] (retail)       [address]  [place], [place]  [address]       Ph: [phone]       Fax: [phone]   RxID:   [id number] SPIRIVA HANDIHALER 18 MCG  CAPS (TIOTROPIUM BROMIDE MONOHYDRATE) Inhale one capsule once daily  #30 Each x 11   Entered and Authorized by:   [name]   Signed by:   [name] on 09/05/2009   Method used:   Electronically to        [facility]* (retail)       [address]       [place], [place]  [address]       Ph: [phone]       Fax: [phone]   RxID:   [id number] ALENDRONATE SODIUM 70 MG  TABS (ALENDRONATE SODIUM) Take 1 tablet by mouth once a day  #30 x 11   Entered and Authorized by:   [name]   Signed by:   [name]  on 09/05/2009   Method used:   Electronically to        [facility] (retail)       [address]       [place], [place]  [address]       Ph: [phone]       Fax: [phone]   RxID:   [id number] PROAIR HFA 108 (90 BASE) MCG/ACT AERS (ALBUTEROL SULFATE) 2 - 4 puffs inhaled every 4-6 hours as needed SOB, wheeze.  #1 x 3   Entered and Authorized by:   [name]   Signed by:   [name] on 09/05/2009   Method used:   Electronically to        [facility] (retail)       [address]       [place], [place]  [address]       Ph: [phone]       Fax: [phone]   RxID:   [id number] SIMVASTATIN 40 MG  TABS (SIMVASTATIN) Take 1 tablet by mouth once a day at bedtime  #30 x 11   Entered and Authorized by:   [name]   Signed by:   [name] on 09/05/2009   Method used:   Electronically to        [facility]* (retail)       [address]       [place], [place]  [address]       Ph: [phone]       Fax: [phone]   RxID:   [id number] LISINOPRIL-HYDROCHLOROTHIAZIDE 20-25 MG  TABS (LISINOPRIL-HYDROCHLOROTHIAZIDE) Take 1 tablet by mouth once a day  #30 Each x 11   Entered and Authorized by:   [name]   Signed by:   [name] on 09/05/2009   Method used:   Electronically to        [facility] (retail)       [address]       [place], [place]  [address]       Ph: [phone]       Fax: [phone]   RxID:   [id number] FAMCICLOVIR 500 MG TABS (FAMCICLOVIR) Take 1 tablet by mouth three times a day for 7 days  #21 x 0   Entered and Authorized by:   [name]   Signed by:   [name] on 09/05/2009   Method used:   Electronically to        [facility] (retail)       [address]  [place], [place]  [address]       Ph: [phone]       Fax: [phone]   RxID:   [id number] LIDOCAINE HCL 2 % GEL (LIDOCAINE HCL) Apply four times a day as needed for rash pain.  #1 tube x 0   Entered  and Authorized by:   [name]   Signed by:   [name] on 09/05/2009   Method used:   Electronically to        [facility] (retail)       [address]       [place], [place]  [address]       Ph: [phone]       Fax: [phone]   RxID:   [id number]

## 2010-08-28 NOTE — Assessment & Plan Note (Signed)
Summary: 6 MONTH FU HTN, lipids   Vital Signs:  Patient profile:   70 year old female Height:      63.1 inches Weight:      205 pounds Pulse rate:   95 / minute BP sitting:   157 / 86  (right arm) Cuff size:   large  Vitals Entered By: Avon Gully CMA, (AAMA) (April 09, 2010 10:30 AM)  Serial Vital Signs/Assessments:  Time      Position  BP       Pulse  Resp  Temp     By 10:42 AM            130/78                         [name]  CC: f/u BP, Hypertension Management   Primary Care Provider:  Linford Arnold, C  CC:  f/u BP and Hypertension Management.  History of Present Illness: Having occ dizzy spells and feels her balance is off. No syncope or near syncope. No HA or vision changes. No swelling recnelty. Has been stable. Occ will notice it.  No exacerbating or alleviating factors.    Hypertension History:      She notes no problems with any antihypertensive medication side effects.        Positive major cardiovascular risk factors include female age 22 years old or older, hyperlipidemia, hypertension, and current tobacco user.     Current Medications (verified): 1)  Lisinopril-Hydrochlorothiazide 20-25 Mg  Tabs (Lisinopril-Hydrochlorothiazide) .... Take 1 Tablet By Mouth Once A Day 2)  Simvastatin 40 Mg  Tabs (Simvastatin) .... Take 1 Tablet By Mouth Once A Day At Bedtime 3)  Oxybutynin Chloride 5 Mg  Tb24 (Oxybutynin Chloride) .... Take 1 Tablet By Mouth Once A Day 4)  Proair Hfa 108 (90 Base) Mcg/act Aers (Albuterol Sulfate) .... 2 - 4 Puffs Inhaled Every 4-6 Hours As Needed Sob, Wheeze. 5)  Alendronate Sodium 70 Mg  Tabs (Alendronate Sodium) .... Take 1 Tablet By Mouth Once A Day 6)  Spiriva Handihaler 18 Mcg  Caps (Tiotropium Bromide Monohydrate) .... Inhale One Capsule Once Daily 7)  Bayer Childrens Aspirin 81 Mg Chew (Aspirin) .... Take 1 Tablet By Mouth Once A Day 8)  Advair Diskus 250-50 Mcg/dose Misc (Fluticasone-Salmeterol) .Marland Kitchen.. 1 Puff Two Times A  Day Inhaled 9)  Fish Oil 1000 Mg Caps (Omega-3 Fatty Acids) .... Take 1 Tablet By Mouth Once A Day 10)  Furosemide 40 Mg Tabs (Furosemide) .... Take 1 Tablet By Mouth Once A Day As Needed For Swelling 11)  Trazodone Hcl 50 Mg Tabs (Trazodone Hcl) .... Take 1-2  Tablet By Mouth Once A Day At Bedtime  Allergies (verified): No Known Drug Allergies  Physical Exam  General:  Well-developed,well-nourished,in no acute distress; alert,appropriate and cooperative throughout examination Lungs:  Normal respiratory effort, chest expands symmetrically. Lungs are clear to auscultation, no crackles or wheezes. Heart:  Normal rate and regular rhythm. S1 and S2 normal without gallop, murmur, click, rub or other extra sounds. Extremities:  NO LE edema.  Skin:  no rashes.   Psych:  Cognition and judgment appear intact. Alert and cooperative with normal attention span and concentration. No apparent delusions, illusions, hallucinations   Impression & Recommendations:  Problem # 1:  ESSENTIAL HYPERTENSION, BENIGN (ICD-401.1)  Repeat BP is at goal.  Looks OK. Will get labs today.  Continue current regimen. Only using lasix occassionally.  Her updated medication list for  this problem includes:    Lisinopril-hydrochlorothiazide 20-25 Mg Tabs (Lisinopril-hydrochlorothiazide) .Marland Kitchen... Take 1 tablet by mouth once a day    Furosemide 40 Mg Tabs (Furosemide) .Marland Kitchen... Take 1 tablet by mouth once a day as needed for swelling  BP today: 157/86 Prior BP: 128/77 (10/03/2009)  Prior 10 Yr Risk Heart Disease: 11 % (10/03/2009)  Labs Reviewed: K+: 4.4 (05/16/2009) Creat: : 1.07 (07/12/2009)   Chol: 177 (12/06/2008)   HDL: 49 (12/06/2008)   LDL: 87 (12/06/2008)   TG: 204 (12/06/2008)  Problem # 2:  HYPERLIPIDEMIA (ICD-272.4) Due to check labs.  Her updated medication list for this problem includes:    Simvastatin 40 Mg Tabs (Simvastatin) .Marland Kitchen... Take 1 tablet by mouth once a day at bedtime  Labs Reviewed: SGOT: 17  (03/03/2009)   SGPT: 21 (03/03/2009)  Lipid Goals: Chol Goal: 200 (03/03/2009)   HDL Goal: 40 (03/03/2009)   LDL Goal: 130 (03/03/2009)   TG Goal: 150 (03/03/2009)  Prior 10 Yr Risk Heart Disease: 11 % (10/03/2009)   HDL:49 (12/06/2008), 46 (05/04/2008)  LDL:87 (12/06/2008), 57 (05/04/2008)  Chol:177 (12/06/2008), 137 (05/04/2008)  Trig:204 (12/06/2008), 168 (05/04/2008)  Complete Medication List: 1)  Lisinopril-hydrochlorothiazide 20-25 Mg Tabs (Lisinopril-hydrochlorothiazide) .... Take 1 tablet by mouth once a day 2)  Simvastatin 40 Mg Tabs (Simvastatin) .... Take 1 tablet by mouth once a day at bedtime 3)  Oxybutynin Chloride 5 Mg Tb24 (Oxybutynin chloride) .... Take 1 tablet by mouth once a day 4)  Proair Hfa 108 (90 Base) Mcg/act Aers (Albuterol sulfate) .... 2 - 4 puffs inhaled every 4-6 hours as needed sob, wheeze. 5)  Alendronate Sodium 70 Mg Tabs (Alendronate sodium) .... Take 1 tablet by mouth once a day 6)  Spiriva Handihaler 18 Mcg Caps (Tiotropium bromide monohydrate) .... Inhale one capsule once daily 7)  Bayer Childrens Aspirin 81 Mg Chew (Aspirin) .... Take 1 tablet by mouth once a day 8)  Advair Diskus 250-50 Mcg/dose Misc (Fluticasone-salmeterol) .Marland Kitchen.. 1 puff two times a day inhaled 9)  Fish Oil 1000 Mg Caps (Omega-3 fatty acids) .... Take 1 tablet by mouth once a day 10)  Furosemide 40 Mg Tabs (Furosemide) .... Take 1 tablet by mouth once a day as needed for swelling  Other Orders: Zoster (Shingles) Vaccine Live [phone]) Admin 1st Vaccine ([address]454) Flu Vaccine 48yrs + (09811) Admin of Any Addtl Vaccine (91478)  Hypertension Assessment/Plan:      The patient's hypertensive risk group is category B: At least one risk factor (excluding diabetes) with no target organ damage.  Her calculated 10 year risk of coronary heart disease is 15 %.  Today's blood pressure is 157/86.    Patient Instructions: 1)  Schedule breathing test for you COPD when convenient.  2)  Given flu shot  today.  3)  Please schedule a follow-up appointment in 4 months for blood pressure .      Immunizations Administered:  Zostavax # 1:    Vaccine Type: Zostavax    Site: right deltoid    Mfr: Merck    Dose: 0.5 ml    Route: IM    Given by: Sue Lush McCrimmon CMA, (AAMA)    Exp. Date: 03/02/2011    Lot #: [id number]    VIS given: 05/10/05 given April 09, 2010.  Influenza Vaccine # 1:    Vaccine Type: Fluvax 3+    Site: left buttock    Mfr: fluarix    Dose: 0.5 ml    Route: IM    Given by:  Sue Lush McCrimmon CMA, (AAMA)    Exp. Date: 01/26/2011    Lot #: [id number]    VIS given: 02/20/10 version given April 09, 2010.  Flu Vaccine Consent Questions:    Do you have a history of severe allergic reactions to this vaccine? no    Any prior history of allergic reactions to egg and/or gelatin? no    Do you have a sensitivity to the preservative Thimersol? no    Do you have a past history of Guillan-Barre Syndrome? no    Do you currently have an acute febrile illness? no    Have you ever had a severe reaction to latex? no    Vaccine information given and explained to patient? no    Are you currently pregnant? no

## 2010-08-28 NOTE — Assessment & Plan Note (Signed)
Summary: 1 mo f/u Diabetes, lipids   Vital Signs:  Patient profile:   70 year old female Height:      63.1 inches Weight:      198 pounds Pulse rate:   59 / minute BP sitting:   134 / 79  (right arm) Cuff size:   large  Vitals Entered By: Avon Gully CMA, Duncan Dull) (June 04, 2010 9:33 AM) CC: f/u blood sugar readings   Primary Care Matrice Herro:  Linford Arnold, C  CC:  f/u blood sugar readings.  History of Present Illness: f/u blood sugar readings. Recently dx with DM in october. She was not started o medication since her [facility] was 6.5. She was to follow her sugars. Home sugars have ranged from 95 to 125 fasting. She has been checking 2-3 x a week. Has lost about 5 lbs. Has changed her diet. Not rexercising. She has noticed some pains on the bottom of both feet. No numbness or tingling. She says she doesn't have time to take the diabetes classes. Says she is too busy.   Diabetes Management History:      The patient is a 70 years old female who comes in for evaluation of DM Type 2.  She has not been enrolled in the "Diabetic Education Program".  She states understanding of dietary principles and is following her diet appropriately.  No sensory loss is reported.  She is checking home blood sugars.  She says that she is not exercising regularly.        Hypoglycemic symptoms are not occurring.  No hyperglycemic symptoms are reported.        There are no symptoms to suggest diabetic complications.  Since her last visit, no infections have occurred.  No changes have been made to her treatment plan since last visit.    Current Medications (verified): 1)  Lisinopril-Hydrochlorothiazide 20-25 Mg  Tabs (Lisinopril-Hydrochlorothiazide) .... Take 1 Tablet By Mouth Once A Day 2)  Simvastatin 40 Mg  Tabs (Simvastatin) .... Take 1 Tablet By Mouth Once A Day At Bedtime 3)  Oxybutynin Chloride 5 Mg  Tb24 (Oxybutynin Chloride) .... Take 1 Tablet By Mouth Once A Day 4)  Proair Hfa 108 (90 Base) Mcg/act Aers  (Albuterol Sulfate) .... 2 - 4 Puffs Inhaled Every 4-6 Hours As Needed Sob, Wheeze. 5)  Spiriva Handihaler 18 Mcg  Caps (Tiotropium Bromide Monohydrate) .... Inhale One Capsule Once Daily 6)  Bayer Childrens Aspirin 81 Mg Chew (Aspirin) .... Take 1 Tablet By Mouth Once A Day 7)  Advair Diskus 250-50 Mcg/dose Misc (Fluticasone-Salmeterol) .Marland Kitchen.. 1 Puff Two Times A Day Inhaled 8)  Fish Oil 1000 Mg Caps (Omega-3 Fatty Acids) .... Take 1 Tablet By Mouth Once A Day 9)  Toprol Xl 25 Mg Xr24h-Tab (Metoprolol Succinate) .... Take 1 Tablet By Mouth Once A Day 10)  Glucometer .... Test Dialy. Stips and Lancets For 90 Days Supply Dx 250.00  Allergies (verified): No Known Drug Allergies  Comments:  Nurse/Medical Assistant: The patient's medications and allergies were reviewed with the patient and were updated in the Medication and Allergy Lists. Avon Gully CMA, Duncan Dull) (June 04, 2010 9:34 AM)  Physical Exam  General:  Well-developed,well-nourished,in no acute distress; alert,appropriate and cooperative throughout examination Lungs:  Normal respiratory effort, chest expands symmetrically. Lungs are clear to auscultation, no crackles or wheezes. Heart:  Normal rate and regular rhythm. S1 and S2 normal without gallop, murmur, click, rub or other extra sounds. Skin:  no rashes.   Cervical Nodes:  No lymphadenopathy noted Psych:  Cognition and judgment appear intact. Alert and cooperative with normal attention span and concentration. No apparent delusions, illusions, hallucinations  Diabetes Management Exam:    Foot Exam (with socks and/or shoes not present):       Sensory-Monofilament:          Left foot: normal          Right foot: normal   Impression & Recommendations:  Problem # 1:  DIABETES MELLITUS, CONTROLLED (ICD-250.00)  Dong well overall. Her home sugars look great.  F/U in 3 months.  Her labs are up to date as well Reminded to get her eye exam. Encouraged her to take the  diabetes classes.  Also encouraged smoking cessation.  Micoralbumin is + for protein, trace. She is already on a ACEi.  Her updated medication list for this problem includes:    Lisinopril-hydrochlorothiazide 20-25 Mg Tabs (Lisinopril-hydrochlorothiazide) .Marland Kitchen... Take 1 tablet by mouth once a day    Bayer Childrens Aspirin 81 Mg Chew (Aspirin) .Marland Kitchen... Take 1 tablet by mouth once a day  Labs Reviewed: Creat: 1.07 (04/09/2010)    Reviewed HgBA1c results: 6.5 (04/30/2010)  Orders: Creatinine  (44034) Urine Microalbumin (74259)  Problem # 2:  HYPERLIPIDEMIA (ICD-272.4)  Her updated medication list for this problem includes:    Simvastatin 40 Mg Tabs (Simvastatin) .Marland Kitchen... Take 1 tablet by mouth once a day at bedtime  Labs Reviewed: SGOT: 11 (04/09/2010)   SGPT: 10 (04/09/2010)  Lipid Goals: Chol Goal: 200 (03/03/2009)   HDL Goal: 40 (03/03/2009)   LDL Goal: 130 (03/03/2009)   TG Goal: 150 (03/03/2009)  Prior 10 Yr Risk Heart Disease: 15 % (04/09/2010)   HDL:53 (04/09/2010), 49 (12/06/2008)  LDL:94 (04/09/2010), 87 (12/06/2008)  Chol:167 (04/09/2010), 177 (12/06/2008)  Trig:102 (04/09/2010), 204 (12/06/2008)  Complete Medication List: 1)  Lisinopril-hydrochlorothiazide 20-25 Mg Tabs (Lisinopril-hydrochlorothiazide) .... Take 1 tablet by mouth once a day 2)  Simvastatin 40 Mg Tabs (Simvastatin) .... Take 1 tablet by mouth once a day at bedtime 3)  Oxybutynin Chloride 5 Mg Tb24 (Oxybutynin chloride) .... Take 1 tablet by mouth once a day 4)  Proair Hfa 108 (90 Base) Mcg/act Aers (Albuterol sulfate) .... 2 - 4 puffs inhaled every 4-6 hours as needed sob, wheeze. 5)  Spiriva Handihaler 18 Mcg Caps (Tiotropium bromide monohydrate) .... Inhale one capsule once daily 6)  Bayer Childrens Aspirin 81 Mg Chew (Aspirin) .... Take 1 tablet by mouth once a day 7)  Advair Diskus 250-50 Mcg/dose Misc (Fluticasone-salmeterol) .Marland Kitchen.. 1 puff two times a day inhaled 8)  Fish Oil 1000 Mg Caps (Omega-3 fatty acids)  .... Take 1 tablet by mouth once a day 9)  Toprol Xl 25 Mg Xr24h-tab (Metoprolol succinate) .... Take 1 tablet by mouth once a day 10)  Glucometer  .... Test dialy. stips and lancets for 90 days supply dx 250.00  Diabetes Management Assessment/Plan:      The following lipid goals have been established for the patient: Total cholesterol goal of 200; LDL cholesterol goal of 130; HDL cholesterol goal of 40; Triglyceride goal of 150.    Patient Instructions: 1)  Remember to get your eye examine this year. Have them send me a report.  2)  Please schedule a follow-up appointment in 3 months for diabetes .    Orders Added: 1)  Est. Patient Level III [56387] 2)  Creatinine  [82570] 3)  Urine Microalbumin [82044]     Laboratory Results   Urine Tests  Date/Time Received:  06/04/10 Date/Time Reported: 06/04/10  Microalbumin (urine): 150 mg/L Creatinine: 200mg /dL  A:C Ratio 16-$[id number]$ /g      Last Pneumovax:  Historical (03/22/2003 8:40:04 PM) Pneumovax Next Due:  5 yr

## 2010-08-28 NOTE — Assessment & Plan Note (Signed)
Summary: 1 MONTH FUP HTN, insomnia, etc   Vital Signs:  Patient profile:   70 year old female Height:      63.1 inches Weight:      198 pounds O2 Sat:      85 % on Room air Pulse rate:   93 / minute BP sitting:   128 / 77  (left arm) Cuff size:   large  Vitals Entered By: Kathlene November (October 03, 2009 10:49 AM)  O2 Flow:  Room air CC: followup BP, COPD and shingles. Shingles have cleared up, Hypertension Management   Primary Care Provider:  Linford Arnold, C  CC:  followup BP, COPD and shingles. Shingles have cleared up, and Hypertension Management.  History of Present Illness: followup BP, COPD and shingles. Shingles have cleared up.  Overall her pain is much better. Had a URI recently.  But feels better. Still with some cough. Using his spiriva and Advair daily. Says this really helps. Still smokes.   insmonia for several months. ONly sleeping a couple of hours a night. No caffeine in diet. Then gets anxious about it. Not SOB or coughing.  No bladder sxs waking her up. Had med for insomnia years ago but doesn't remeber what it was. No known triggers. No real alleviating sxs.   Hypertension History:      She denies headache, chest pain, palpitations, dyspnea with exertion, orthopnea, PND, peripheral edema, visual symptoms, neurologic problems, syncope, and side effects from treatment.  She notes no problems with any antihypertensive medication side effects.        Positive major cardiovascular risk factors include female age 2 years old or older, hyperlipidemia, hypertension, and current tobacco user.     Current Medications (verified): 1)  Lisinopril-Hydrochlorothiazide 20-25 Mg  Tabs (Lisinopril-Hydrochlorothiazide) .... Take 1 Tablet By Mouth Once A Day 2)  Simvastatin 40 Mg  Tabs (Simvastatin) .... Take 1 Tablet By Mouth Once A Day At Bedtime 3)  Oxybutynin Chloride 5 Mg  Tb24 (Oxybutynin Chloride) .... Take 1 Tablet By Mouth Once A Day 4)  Proair Hfa 108 (90 Base) Mcg/act Aers  (Albuterol Sulfate) .... 2 - 4 Puffs Inhaled Every 4-6 Hours As Needed Sob, Wheeze. 5)  Alendronate Sodium 70 Mg  Tabs (Alendronate Sodium) .... Take 1 Tablet By Mouth Once A Day 6)  Spiriva Handihaler 18 Mcg  Caps (Tiotropium Bromide Monohydrate) .... Inhale One Capsule Once Daily 7)  Bayer Childrens Aspirin 81 Mg Chew (Aspirin) .... Take 1 Tablet By Mouth Once A Day 8)  Advair Diskus 250-50 Mcg/dose Misc (Fluticasone-Salmeterol) .Marland Kitchen.. 1 Puff Two Times A Day Inhaled 9)  Fish Oil 1000 Mg Caps (Omega-3 Fatty Acids) .... Take 1 Tablet By Mouth Once A Day 10)  Furosemide 40 Mg Tabs (Furosemide) .... Take 1 Tablet By Mouth Once A Day As Needed For Swelling 11)  Medium Grade Below The Knee Compression Stockings. .... Please Measure  and Fit For The Right and Leg Left.  Allergies (verified): No Known Drug Allergies  Comments:  Nurse/Medical Assistant: The patient's medications and allergies were reviewed with the patient and were updated in the Medication and Allergy Lists. Kathlene November (October 03, 2009 10:50 AM)  Social History: Reviewed history from 03/03/2009 and no changes required. Homemaker.  HS degree.   Married to [employer] (MI x 2 , stroke, DM)  with 3 adult children.   Current Smoker- 3/4 ppd, down from 1ppd Alcohol use-no Drug use-no Regular exercise-no  Physical Exam  General:  Well-developed,well-nourished,in  no acute distress; alert,appropriate and cooperative throughout examination Head:  Normocephalic and atraumatic without obvious abnormalities. No apparent alopecia or balding. Eyes:  No corneal or conjunctival inflammation noted. EOMI. Perrla. Ears:  External ear exam shows no significant lesions or deformities.  Otoscopic examination reveals clear canals, tympanic membranes are intact bilaterally without bulging, retraction, inflammation or discharge. Hearing is grossly normal bilaterally. Nose:  External nasal examination shows no deformity or inflammation. Nasal mucosa  are pink and moist without lesions or exudates. Mouth:  Oral mucosa and oropharynx without lesions or exudates.  Teeth in good repair. Neck:  No deformities, masses, or tenderness noted. Lungs:  Normal respiratory effort, chest expands symmetrically. Lungs are clear to auscultation, no crackles or wheezes. Heart:  Normal rate and regular rhythm. S1 and S2 normal without gallop, murmur, click, rub or other extra sounds. Skin:  Rash on her left low back  his healing well. Some dry crusting of teh papules.  Skin is a dusky pink color.   Cervical Nodes:  No lymphadenopathy noted Psych:  Cognition and judgment appear intact. Alert and cooperative with normal attention span and concentration. No apparent delusions, illusions, hallucinations   Impression & Recommendations:  Problem # 1:  ESSENTIAL HYPERTENSION, BENIGN (ICD-401.1)  Due for labs at the end of May. Printed slip fo today. Bp looks great today.   Fu in 4 months.  Her updated medication list for this problem includes:    Lisinopril-hydrochlorothiazide 20-25 Mg Tabs (Lisinopril-hydrochlorothiazide) .Marland Kitchen... Take 1 tablet by mouth once a day    Furosemide 40 Mg Tabs (Furosemide) .Marland Kitchen... Take 1 tablet by mouth once a day as needed for swelling  Orders: T-Comprehensive Metabolic Panel ([id number])  BP today: 128/77 Prior BP: 149/86 (09/05/2009)  10 Yr Risk Heart Disease: 11 % Prior 10 Yr Risk Heart Disease: 15 % (03/03/2009)  Labs Reviewed: K+: 4.4 (05/16/2009) Creat: : 1.07 (07/12/2009)   Chol: 177 (12/06/2008)   HDL: 49 (12/06/2008)   LDL: 87 (12/06/2008)   TG: 204 (12/06/2008)  Problem # 2:  SHINGLES (ICD-053.9) Assessment: Improved Resovling.  Her pain has improved overall.  Rash is improving.   Problem # 3:  COPD (ICD-496) Assessment: Unchanged Doing well stable. REcovering from a URI.  Her updated medication list for this problem includes:    Proair Hfa 108 (90 Base) Mcg/act Aers (Albuterol sulfate) .Marland Kitchen... 2 - 4 puffs inhaled  every 4-6 hours as needed sob, wheeze.    Spiriva Handihaler 18 Mcg Caps (Tiotropium bromide monohydrate) ..... Inhale one capsule once daily    Advair Diskus 250-50 Mcg/dose Misc (Fluticasone-salmeterol) .Marland Kitchen... 1 puff two times a day inhaled  Problem # 4:  INSOMNIA (ICD-780.52) Assessment: New Discussed options.  Reviewed sleep hygiene.  Recommend trial of sleep aid.Will start with trazodone since no dependency issues. pt to start with 1/2 a tab and can increase up to 2 tabs if needed. Call if any concerns. Hopefully will only  need briefly to reset the sleep cycyle.   Complete Medication List: 1)  Lisinopril-hydrochlorothiazide 20-25 Mg Tabs (Lisinopril-hydrochlorothiazide) .... Take 1 tablet by mouth once a day 2)  Simvastatin 40 Mg Tabs (Simvastatin) .... Take 1 tablet by mouth once a day at bedtime 3)  Oxybutynin Chloride 5 Mg Tb24 (Oxybutynin chloride) .... Take 1 tablet by mouth once a day 4)  Proair Hfa 108 (90 Base) Mcg/act Aers (Albuterol sulfate) .... 2 - 4 puffs inhaled every 4-6 hours as needed sob, wheeze. 5)  Alendronate Sodium 70 Mg Tabs (Alendronate sodium) .Marland KitchenMarland KitchenMarland Kitchen  Take 1 tablet by mouth once a day 6)  Spiriva Handihaler 18 Mcg Caps (Tiotropium bromide monohydrate) .... Inhale one capsule once daily 7)  Bayer Childrens Aspirin 81 Mg Chew (Aspirin) .... Take 1 tablet by mouth once a day 8)  Advair Diskus 250-50 Mcg/dose Misc (Fluticasone-salmeterol) .Marland Kitchen.. 1 puff two times a day inhaled 9)  Fish Oil 1000 Mg Caps (Omega-3 fatty acids) .... Take 1 tablet by mouth once a day 10)  Furosemide 40 Mg Tabs (Furosemide) .... Take 1 tablet by mouth once a day as needed for swelling 11)  Trazodone Hcl 50 Mg Tabs (Trazodone hcl) .... Take 1-2  tablet by mouth once a day at bedtime  Other Orders: T-Lipid Profile ([id number])  Hypertension Assessment/Plan:      The patient's hypertensive risk group is category B: At least one risk factor (excluding diabetes) with no target organ damage.  Her  calculated 10 year risk of coronary heart disease is 11 %.  Today's blood pressure is 128/77.   Prescriptions: TRAZODONE HCL 50 MG TABS (TRAZODONE HCL) Take 1-2  tablet by mouth once a day at bedtime  #45 x 0   Entered and Authorized by:   [name]   Signed by:   [name] on 10/03/2009   Method used:   Electronically to        [facility]* (retail)       [address]       [place], [place]  [address]       Ph: [phone]       Fax: [phone]   RxID:   [id number] OXYBUTYNIN CHLORIDE 5 MG  TB24 (OXYBUTYNIN CHLORIDE) Take 1 tablet by mouth once a day  #30 Each x 6   Entered and Authorized by:   [name]   Signed by:   [name] on 10/03/2009   Method used:   Electronically to        [facility]* (retail)       [address]       [place], [place]  [address]       Ph: [phone]       Fax: [phone]   RxID:   [id number]   Appended Document: 1 MONTH FUP HTN, insomnia, etc

## 2010-08-28 NOTE — Progress Notes (Signed)
Summary: Pt doesn't want diabetic classes  ---- Converted from flag ---- ---- 04/30/2010 2:54 PM, Michaelle Copas wrote: I had called and set patient up for Diabetic Nutritional classes here in [place] and pt. request that I cancel this because she does not have time for this.... Thanks, Victorino Dike ------------------------------

## 2010-08-28 NOTE — Progress Notes (Signed)
----   Converted from flag ---- ---- 05/21/2010 12:58 PM, [name] wrote: Call pt: Overdue for screening colonscopy for colon Ca. Would she be willing to go. ------------------------------  05/21/10 4:51 acm pt states she would not be willing to do a flu shot at this time

## 2010-09-04 ENCOUNTER — Ambulatory Visit: Payer: Self-pay | Admitting: Family Medicine

## 2010-09-11 ENCOUNTER — Encounter: Payer: Self-pay | Admitting: Family Medicine

## 2010-09-11 ENCOUNTER — Ambulatory Visit (INDEPENDENT_AMBULATORY_CARE_PROVIDER_SITE_OTHER): Payer: Medicare Other | Admitting: Family Medicine

## 2010-09-11 DIAGNOSIS — Z23 Encounter for immunization: Secondary | ICD-10-CM

## 2010-09-11 DIAGNOSIS — E119 Type 2 diabetes mellitus without complications: Secondary | ICD-10-CM

## 2010-09-11 DIAGNOSIS — I10 Essential (primary) hypertension: Secondary | ICD-10-CM

## 2010-09-11 DIAGNOSIS — G4700 Insomnia, unspecified: Secondary | ICD-10-CM

## 2010-09-19 NOTE — Assessment & Plan Note (Signed)
Summary: 3 mo DM, HTN, etc   Vital Signs:  Patient profile:   70 year old female Height:      63.1 inches Weight:      194 pounds Pulse rate:   70 / minute BP sitting:   158 / 88  (right arm) Cuff size:   large  Vitals Entered By: Avon Gully CMA, (AAMA) (September 11, 2010 10:26 AM) CC: f/u DM   CC:  f/u DM.  Diabetes Management History:      The patient is a 70 years old female who comes in for evaluation of DM Type 2.  She has not been enrolled in the "Diabetic Education Program".  She states understanding of dietary principles and is following her diet appropriately.  Self foot exams are being performed.  She is checking home blood sugars.  She says that she is not exercising regularly.        Hypoglycemic symptoms are not occurring.  No hyperglycemic symptoms are reported.        There are no symptoms to suggest diabetic complications.  Since her last visit, no infections have occurred.  No changes have been made to her treatment plan since last visit.        Her home blood sugars include fasting blood sugars: highest: 130, lowest: 98.    Current Medications (verified): 1)  Lisinopril-Hydrochlorothiazide 20-25 Mg  Tabs (Lisinopril-Hydrochlorothiazide) .... Take 1 Tablet By Mouth Once A Day 2)  Simvastatin 40 Mg  Tabs (Simvastatin) .... Take 1 Tablet By Mouth Once A Day At Bedtime 3)  Oxybutynin Chloride 5 Mg  Tb24 (Oxybutynin Chloride) .... Take 1 Tablet By Mouth Once A Day 4)  Proair Hfa 108 (90 Base) Mcg/act Aers (Albuterol Sulfate) .... 2 - 4 Puffs Inhaled Every 4-6 Hours As Needed Sob, Wheeze. 5)  Spiriva Handihaler 18 Mcg  Caps (Tiotropium Bromide Monohydrate) .... Inhale One Capsule Once Daily 6)  Bayer Childrens Aspirin 81 Mg Chew (Aspirin) .... Take 1 Tablet By Mouth Once A Day 7)  Advair Diskus 250-50 Mcg/dose Misc (Fluticasone-Salmeterol) .Marland Kitchen.. 1 Puff Two Times A Day Inhaled 8)  Fish Oil 1000 Mg Caps (Omega-3 Fatty Acids) .... Take 1 Tablet By Mouth Once A Day 9)   Toprol Xl 25 Mg Xr24h-Tab (Metoprolol Succinate) .... Take 1 Tablet By Mouth Once A Day 10)  Glucometer .... Test Dialy. Stips and Lancets For 90 Days Supply Dx 250.00  Allergies (verified): No Known Drug Allergies  Comments:  Nurse/Medical Assistant: The patient's medications and allergies were reviewed with the patient and were updated in the Medication and Allergy Lists. Avon Gully CMA, Duncan Dull) (September 11, 2010 10:27 AM)  Physical Exam  General:  Well-developed,well-nourished,in no acute distress; alert,appropriate and cooperative throughout examination Head:  Normocephalic and atraumatic without obvious abnormalities. No apparent alopecia or balding. Neck:  No deformities, masses, or tenderness noted. Lungs:  Prolonged expiration. No rhonchi or wheezes. She smells of cigarette smoke.  Heart:  Normal rate and regular rhythm. S1 and S2 normal without gallop, murmur, click, rub or other extra sounds. Pulses:  Radial 2+ bilat.  Extremities:  No LE edema.    Impression & Recommendations:  Problem # 1:  DIABETES MELLITUS, CONTROLLED (ICD-250.00) Assessment Unchanged Due for eye exam. Has lost 4 lbs and is doing well. doing well on cutting back on sweets.  A1C looks great.  Keep up the good work on the weight loss.  Pneumovac given today.  Her updated medication list for this problem includes:  Lisinopril-hydrochlorothiazide 20-25 Mg Tabs (Lisinopril-hydrochlorothiazide) .Marland Kitchen... Take 1 tablet by mouth once a day    Bayer Childrens Aspirin 81 Mg Chew (Aspirin) .Marland Kitchen... Take 1 tablet by mouth once a day  Orders: Fingerstick (36416) Hgb A1C (85462VO)  Problem # 2:  ESSENTIAL HYPERTENSION, BENIGN (ICD-401.1) Assessment: Deteriorated Changed the metoprolol to two times a day for cost savings.  Inc metoprolol to 50mg  two times a day  Her updated medication list for this problem includes:    Lisinopril-hydrochlorothiazide 20-25 Mg Tabs (Lisinopril-hydrochlorothiazide) .Marland Kitchen... Take  1 tablet by mouth once a day    Metoprolol Tartrate 50 Mg Tabs (Metoprolol tartrate) .Marland Kitchen... Take 1 tablet by mouth two times a day  Problem # 3:  INSOMNIA (ICD-780.52) Assessment: Deteriorated Having insomnia again and would like to have a refill on the trazodone. Says worked well as needed in the past.   Problem # 4:  COPD (ICD-496) Assessment: Unchanged Sampels given today. She still smokes.  Due for pnemovac today. Given  Her updated medication list for this problem includes:    Proair Hfa 108 (90 Base) Mcg/act Aers (Albuterol sulfate) .Marland Kitchen... 2 - 4 puffs inhaled every 4-6 hours as needed sob, wheeze.    Spiriva Handihaler 18 Mcg Caps (Tiotropium bromide monohydrate) ..... Inhale one capsule once daily    Advair Diskus 250-50 Mcg/dose Misc (Fluticasone-salmeterol) .Marland Kitchen... 1 puff two times a day inhaled  Complete Medication List: 1)  Lisinopril-hydrochlorothiazide 20-25 Mg Tabs (Lisinopril-hydrochlorothiazide) .... Take 1 tablet by mouth once a day 2)  Simvastatin 40 Mg Tabs (Simvastatin) .... Take 1 tablet by mouth once a day at bedtime 3)  Oxybutynin Chloride 5 Mg Tabs (Oxybutynin chloride) .... Take 1 tablet by mouth two times a day 4)  Proair Hfa 108 (90 Base) Mcg/act Aers (Albuterol sulfate) .... 2 - 4 puffs inhaled every 4-6 hours as needed sob, wheeze. 5)  Spiriva Handihaler 18 Mcg Caps (Tiotropium bromide monohydrate) .... Inhale one capsule once daily 6)  Bayer Childrens Aspirin 81 Mg Chew (Aspirin) .... Take 1 tablet by mouth once a day 7)  Advair Diskus 250-50 Mcg/dose Misc (Fluticasone-salmeterol) .Marland Kitchen.. 1 puff two times a day inhaled 8)  Fish Oil 1000 Mg Caps (Omega-3 fatty acids) .... Take 1 tablet by mouth once a day 9)  Metoprolol Tartrate 50 Mg Tabs (Metoprolol tartrate) .... Take 1 tablet by mouth two times a day 10)  Glucometer  .... Test dialy. stips and lancets for 90 days supply dx 250.00 11)  Trazodone Hcl 50 Mg Tabs (Trazodone hcl) .... 1/2 or 1 at bedtime as needed for  insomnia  Diabetes Management Assessment/Plan:      The following lipid goals have been established for the patient: Total cholesterol goal of 200; LDL cholesterol goal of 130; HDL cholesterol goal of 40; Triglyceride goal of 150.    Patient Instructions: 1)  Dont' forget diabetic eye exam  2)  Please schedule a follow-up appointment in 3-4 months for diabetes.  3)  REfilled the sleep medication.  Prescriptions: METOPROLOL TARTRATE 50 MG TABS (METOPROLOL TARTRATE) Take 1 tablet by mouth two times a day  #30 x 3   Entered and Authorized by:   [name]   Signed by:   [name] on 09/11/2010   Method used:   Electronically to        [facility]* (retail)       [address]       [place], [place]  [address]       Ph:  [phone]       Fax: [phone]   RxID:   [id number] TRAZODONE HCL 50 MG TABS (TRAZODONE HCL) 1/2 or 1 at bedtime as needed for insomnia  #30 x 1   Entered and Authorized by:   [name]   Signed by:   [name] on 09/11/2010   Method used:   Electronically to        [facility]* (retail)       [address]       [place], [place]  [address]       Ph: [phone]       Fax: [phone]   RxID:   [id number] OXYBUTYNIN CHLORIDE 5 MG TABS (OXYBUTYNIN CHLORIDE) Take 1 tablet by mouth two times a day  #60 x 11   Entered and Authorized by:   [name]   Signed by:   [name] on 09/11/2010   Method used:   Electronically to        [facility]* (retail)       [address]       [place], [place]  [address]       Ph: [phone]       Fax: [phone]   RxID:   [id number] METOPROLOL TARTRATE 25 MG TABS (METOPROLOL TARTRATE) Take 1 tablet by mouth two times a day  #60 x 11   Entered and Authorized by:   [name]   Signed by:   [name] on 09/11/2010   Method used:   Electronically to        [facility]* (retail)       [address]       [place], [place]  [address]       Ph:  [phone]       Fax: [phone]   RxID:   [id number] LISINOPRIL-HYDROCHLOROTHIAZIDE 20-25 MG  TABS (LISINOPRIL-HYDROCHLOROTHIAZIDE) Take 1 tablet by mouth once a day  #30 Each x 11   Entered and Authorized by:   [name]   Signed by:   [name] on 09/11/2010   Method used:   Electronically to        [facility]* (retail)       [address]       [place], [place]  [address]       Ph: [phone]       Fax: [phone]   RxID:   [id number] SIMVASTATIN 40 MG  TABS (SIMVASTATIN) Take 1 tablet by mouth once a day at bedtime  #30 x 11   Entered and Authorized by:   [name]   Signed by:   [name] on 09/11/2010   Method used:   Electronically to        [facility]* (retail)       [address]       [place], [place]  [address]       Ph: [phone]       Fax: [phone]   RxID:   [id number]    Orders Added: 1)  Fingerstick [36416] 2)  Hgb A1C [83036QW] 3)  Est. Patient Level IV [94174]    Laboratory Results   Blood Tests   Date/Time Received: 09/11/10 Date/Time Reported: 09/11/10       Appended Document: 3 mo DM, HTN, etc   Immunizations Administered:  Pneumonia Vaccine:    Vaccine Type: Pneumovax (Medicare)    Site: left deltoid    Mfr: Merck    Dose: 0.5 ml  Route: IM    Given by: Sue Lush McCrimmon CMA, (AAMA)    Exp. Date: 11/23/2011    Lot #: [id number]    VIS given: 07/03/09 version given September 11, 2010.   Appended Document: 3 mo DM, HTN, etc    Clinical Lists Changes  Observations: Added new observation of HGBA1C: 6.2 % (09/11/2010 12:12)      Laboratory Results   Blood Tests   Date/Time Received: 09/11/10 Date/Time Reported: 09/11/10  HGBA1C: 6.2%   (Normal Range: Non-Diabetic - 3-6%   Control Diabetic - 6-8%)

## 2010-11-03 LAB — CBC
HCT: 46.7 % — ABNORMAL HIGH (ref 36.0–46.0)
Hemoglobin: 15.9 g/dL — ABNORMAL HIGH (ref 12.0–15.0)
RDW: 15.5 % (ref 11.5–15.5)

## 2010-11-03 LAB — BASIC METABOLIC PANEL
BUN: 20 mg/dL (ref 6–23)
GFR calc non Af Amer: 36 mL/min — ABNORMAL LOW (ref >60)
Glucose, Bld: 106 mg/dL — ABNORMAL HIGH (ref 70–99)
Potassium: 4.3 mEq/L (ref 3.5–5.1)

## 2010-11-03 LAB — ABO/RH: ABO/RH(D): O POS

## 2010-11-23 ENCOUNTER — Other Ambulatory Visit: Payer: Self-pay | Admitting: Family Medicine

## 2010-12-03 ENCOUNTER — Ambulatory Visit
Admission: RE | Admit: 2010-12-03 | Discharge: 2010-12-03 | Disposition: A | Discharge status: Home / Self Care | Payer: Medicare Other | Source: Ambulatory Visit | Attending: Orthopedic Surgery | Admitting: Orthopedic Surgery

## 2010-12-03 ENCOUNTER — Other Ambulatory Visit: Payer: Self-pay | Admitting: Orthopedic Surgery

## 2010-12-03 DIAGNOSIS — Z981 Arthrodesis status: Secondary | ICD-10-CM

## 2010-12-11 NOTE — Procedures (Signed)
DUPLEX DEEP VENOUS EXAM - LOWER EXTREMITY   INDICATION:  Left lower extremity edema.   HISTORY:  Edema:  Yes  Trauma/Surgery:  No  Pain:  Yes  PE:  No  Previous DVT:  No  Anticoagulants:  Other:   DUPLEX EXAM:                CFV   SFV   PopV  PTV    GSV                R  L  R  L  R  L  R   L  R  L  Thrombosis    0  0     0     0      0     0  Spontaneous   +  +     +     +      +     +  Phasic        +  +     +     +      +     +  Augmentation  +  +     +     +      +     +  Compressible  +  +     +     +      +     +  Competent     +  +     +     +      +     +   Legend:  + - yes  o - no  p - partial  D - decreased   IMPRESSION:  No evidence of deep venous thrombosis and/or reflux noted  in the left leg.    _____________________________  Quita Skye Hart [place], M.D.   MG/MEDQ  D:  06/27/2009  T:  06/28/2009  Job:  [id number]

## 2010-12-11 NOTE — Op Note (Signed)
Charlotte Brewer, Charlotte Brewer                 ACCOUNT NO.:  [id number]   MEDICAL RECORD NO.:  [id number]          PATIENT TYPE:  INP   LOCATION:  2899                         FACILITY:  [facility]   PHYSICIAN:  Alvy Beal, MD    DATE OF BIRTH:  1940/11/05   DATE OF PROCEDURE:  DATE OF DISCHARGE:                               OPERATIVE REPORT   PREOPERATIVE DIAGNOSIS:  Degenerative disk disease L5-S1 with diskogenic  back pain and radicular left leg pain.   POSTOPERATIVE DIAGNOSIS:  Degenerative disk disease L5-S1 with  diskogenic back pain and radicular left leg pain.   OPERATIVE PROCEDURE:  Transforaminal lumbar interbody fusion L5-S1 using  a minimally invasive technique.  Instrumentation use with minimally  invasive system with 7.5 diameter pedicle screws, 40-mm at L5 on the  left, 35-mm at S1 on the left, and on the right 6.5 diameter pedicle  screws, 40 mm on the right, 35 mm at S1 on the right.   COMPLICATIONS:  None.  All intraoperative EMG and neuro monitoring  remained normal.  There was no electrodiagnostic evidence during the  case of pedicle breach or nerve injury.   FIRST ASSISTANT:  Lauraine Rinne.   HISTORY:  This is a very pleasant 70 year old woman with longstanding  debilitating back, buttock, and left leg pain.  Attempts at conservative  care had failed to alleviate pain and so she elected to proceed with  surgery.  All appropriate risks, benefits, and alternatives were  discussed and consent was obtained.   OPERATIVE NOTE:  The patient was brought to the operating room, placed  supine on the operating table.  After successful induction of general  anesthesia and endotracheal intubation, TEDs, SCDs, and a Foley were  inserted.  Appropriate neuro monitoring needle leads were placed to the  lower extremity and secured.  She was turned prone onto a Wilson frame  and the back was prepped and draped in standard fashion.   We then tested the leads, we had adequate  stimulation.   At this point, an appropriate time-out was done confirming patient,  procedure, and extremity of pain.  Once this was done, fluoro was  brought to the case thoroughly to identify the lateral border of the  left L5 pedicle.  Because the left leg was more symptomatic extremity,  the incision was made to do the TLIF on this side.   The percutaneous Jamshidi needle was advanced through the skin  approximately 3 fingerbreadths from the midline.  I was able to palpate  the L5 transverse process and feel the lateral aspect of the facet  complex.  Once I had this, I then advanced pedicle probe into the  pedicle of L5.  On the AP film, I advanced it to about 2-3 mm lateral to  the medial wall of the L5 pedicle.  I then checked the lateral view,  indicating that I was just at the posterior aspect of the pedicle  vertebral body junction.  At this point, electrodiagnostically was also  no evidence of pedicle breach.  I then advanced the probe into the  posterior  aspect of the vertebral bodies and then placed the guide pin.  I repeated this procedure at S1 again without complication.  At this  point with both guide pins in place, I then made an incision between the  2 percutaneous guide pins and dissected down sharply to the deep fascia.  I incised the deep fascia and then used a Cobb to dissect the paraspinal  muscle.  With the blunt dissection complete, I then advanced the trocar  over the guide pin and measured, so I knew how deep the retracting  blades needed to be.  I then tapped with the 525 tap and placed a 7.5  diameter 40-mm pedicle screw attached to the 70-mm long retracting blade  to the appropriate depth.  Again, electrodiagnostically there was no  evidence of pedicle breach or nerve compression, either during tapping  or placement of the screw.  I repeated this procedure at S1; however,  putting a 35-mm length screw.  At this point with the pedicle screws in  place, I  connected it to the retracting device and then secured it with  the arm to the table.  I then expanded the blades and distracted gently  the L5-S1 interbody space.  I then placed the central blade and then  bluntly dissected and removed any remaining muscular fragments.  At this  point, I had excellent visualization of the L5 pars, the L5-S1 facet  complex, and the S1 lamina.   At this point, I proceeded with the decompression.  Using a sharp  osteotome, I resected the inferior L5 facet.  I then developed a plane  with a curved curette underneath the S1 lamina and performed a complete  S1 laminectomy.  I could now visualize the lateral border of the thecal  sac.  There was significant epidural veins, which I coagulated with  bipolar electrocautery.  I then dissected down and removed the remaining  portion of the L5 pars completely unroofing the L5 nerve root.  At this  point, I could visualize it and I protected it with a neuro patty.  Inferiorly, I began resecting the remaining portion of the S1 superior  facet until I could visualize the S1 medial border of the S1 pedicle.  I  again directly visualize the medial border, palpated around to the  inferior border, and I was able to visualize the superior border, and  there was no evidence of the pedicle breach.   With the pars intra-articularis completely removed, the L5 facet  removed, and the S1 laminectomy complete, I had an adequate  decompression.  I then swept the thecal sac medially, protected it with  D'Errico and palpated the disk space.  There was significant anterior  osteophytes, which I used an osteotome to breach.  I then began using  pituitary rongeurs, curettes, and Kerrison rongeurs to remove the entire  L5-S1 disk complex.   Once I had removed all the disk material, I rasped the endplates, so  that I had bleeding bone.  I then measured with trial implant until I  decided to use a 9 x 10 trial interbody spacer.  I packed  this with  Actifuse.  At this point, I irrigated the wound copiously with normal  saline and then placed bone that I had harvested during the  decompression along the anterior annulus.  I then mixed this with the  remaining amount of Actifuse and used an osteotome to compressive  forward.  I then inserted the graft and tamped  it down, so that was  within anterior two-thirds of the vertebral body and it was horizontal.   Once this was complete, I irrigated copiously and then obtained  hemostasis with bipolar electrocautery.  Once the epidural veins were  coagulated, I then measured for a rod and placed a double-bolt rod and  secured it to the pedicle screws.  I then placed a thrombin-soaked  Gelfoam patty over the laminectomy site and then closed the deep fascia  with interrupted 0 Vicryl sutures, superficial with 2-0 Vicryl sutures,  and a 3-0 Monocryl for the skin.   Steri-Strips and dry dressing were applied and then I went to the  contralateral side.   Again, using fluoroscopy, identified the appropriate starting position  approximately 3 fingerbreadths from midline.  I advanced the probe  through the skin down to the lateral aspect of the L5 pedicle.  I  advanced the pedicle probe through the pedicle until the probe itself  was just about 2-3 mm lateral to the medial wall of the pedicle in the  AP plane.  I then switched to the lateral plane confirming trajectory.  At this point, noting that the pedicle probe was just at the pedicle  vertebral body junction, I advanced an intervertebral body.  Again,  electrodiagnostic studies indicated there was no pedicle breach.  At  this point, I advanced the guide pin through the trocar and then removed  the trocar.  I repeated this entire procedure for the S1 pedicle.   I then made an incision connecting the guide pins together, then  dissected down to deep fascia, and incised the deep fascia as well.  I  then took a Cobb and removed the  L5-S1 facet complex and placed the  remaining amount of bone I had in the posterior lateral gutter.   Once this was done, I placed 6.5 diameter pedicle screws on this right  side.  Again, I tapped using real time neuro monitoring and fluoro, and  placed the pedicle screw.   Once both screws were positioned, I placed a 25-mm rod, secured it down,  and final torqued it.  I then removed the inserting devices, irrigated  copiously with normal saline, closed the deep fascia with interrupted 0  Vicryl sutures, superficial with 2-0 Vicryl sutures, and a 3-0 Monocryl  for the skin.  Steri-Strips and a dry dressing were applied.  The  patient was extubated, transferred to the PACU without incident.  At the  end of the case, all needle and sponge counts were correct.      Alvy Beal, MD  Electronically Signed     DDB/MEDQ  D:  03/29/2009  T:  03/30/2009  Job:  [id number]

## 2010-12-11 NOTE — Consult Note (Signed)
NEW PATIENT CONSULTATION   Brewer, Charlotte B  DOB:  1940/12/21                                       06/27/2009  CHART#:[id number]   The patient is a 70 year old female patient referred by Dr. Linford Arnold to  rule out vascular insufficiency with swelling in the left leg.  The  patient states that she has been having left leg swelling for many years  compared to the right side.  She has discomfort in both legs most severe  occurring in the right hip which radiates down into the right thigh  occurring with walking.  She is unable to walk a block because of  discomfort and dyspnea on exertion.  She recently had lumbar disk  surgery by Dr. Shon Baton which improved her back pain.  She has no history  of deep venous thrombosis, thrombophlebitis, bleeding, ulceration,  pulmonary emboli.  Does not wear elastic compression stockings nor  elevate her legs but does take oxycodone for pain because of her recent  back surgery.   PAST MEDICAL HISTORY:  Chronic problems include:  1. Hypertension.  2. Hyperlipidemia.  3. COPD.  4. Tobacco abuse.  5. Lumbar disk disease.  6. Negative for coronary artery disease, diabetes or stroke.   FAMILY HISTORY:  Positive for diabetes in two brothers and stroke in her  father.  Negative for coronary artery disease.   SOCIAL HISTORY:  She is married, has 3 children.  Smokes a pack of  cigarettes per day for 50 plus years.  Does not use alcohol.   REVIEW OF SYSTEMS:  Positive for dyspnea on exertion and occasionally at  rest.  Also with asthma, wheezing, leg discomfort with lying flat and  with walking, diffuse arthritis, joint pain, muscle pain.  All other  systems negative.   PHYSICAL EXAMINATION:  Vital signs:  Blood pressure is 143/80, heart  rate 88, respirations 14, temperature 97.  General:  She is alert and  oriented x3.  She is a well-developed, well-nourished female in no  apparent distress.  HEENT:  Exam is unremarkable.  EOMs  intact.  Neck:  Supple, 3+ carotid pulse.  No bruits audible.  Lungs:  Clear with no  rhonchi.  Cardiovascular:  Regular rhythm.  No murmurs.  Abdomen:  Obese.  No palpable masses.  She has 3+ femoral, popliteal and 2+  dorsalis pedis pulses bilaterally.  Left leg is 2-3 cm larger than the  right in the circumference particularly below the knee.  There is no  hyperpigmentation or ulceration noted.  No varicosities are noted.   I ordered a venous duplex exam in the office today and interpreted it  and reviewed it.  I also reviewed her old office records.  She has no  deep venous obstruction or reflux in the superficial venous system  including her saphenous veins.   Her discomfort is originating from some source other than her  vasculature, possibly lumbar disk disease or nerve compression from some  source.  I reassured her regarding this.  She does not need any further  vascular workup.  She does have some chronic edema in the left leg which  is not her most symptomatic extremity at this time.   Quita Skye Hart [place], M.D.  Electronically Signed   JDL/MEDQ  D:  06/27/2009  T:  06/28/2009  Job:  [id number]   cc:  Nani Gasser, M.D.

## 2010-12-13 ENCOUNTER — Encounter: Payer: Self-pay | Admitting: Family Medicine

## 2010-12-18 ENCOUNTER — Encounter: Payer: Self-pay | Admitting: Family Medicine

## 2010-12-18 ENCOUNTER — Ambulatory Visit (INDEPENDENT_AMBULATORY_CARE_PROVIDER_SITE_OTHER): Payer: Medicare Other | Admitting: Family Medicine

## 2010-12-18 DIAGNOSIS — I10 Essential (primary) hypertension: Secondary | ICD-10-CM

## 2010-12-18 DIAGNOSIS — E119 Type 2 diabetes mellitus without complications: Secondary | ICD-10-CM

## 2010-12-18 DIAGNOSIS — J4489 Other specified chronic obstructive pulmonary disease: Secondary | ICD-10-CM

## 2010-12-18 DIAGNOSIS — J449 Chronic obstructive pulmonary disease, unspecified: Secondary | ICD-10-CM

## 2010-12-18 MED ORDER — METOPROLOL TARTRATE 100 MG PO TABS: 100.0000 mg | ORAL_TABLET | Freq: Two times a day (BID) | ORAL | Status: DC

## 2010-12-18 MED ORDER — LISINOPRIL-HYDROCHLOROTHIAZIDE 20-25 MG PO TABS: 1.0000 | ORAL_TABLET | Freq: Every day | ORAL | Status: DC

## 2010-12-18 NOTE — Assessment & Plan Note (Addendum)
She has had some ups and downs but says her average is 120. Her A1C is 6.4. Well controlled. Follow up in 4 months. Due for BMP.

## 2010-12-18 NOTE — Progress Notes (Signed)
  Subjective:    Patient ID: Charlotte Brewer, female    DOB: February 09, 1941, 70 y.o.   MRN: [id number]  Diabetes She presents for her follow-up diabetic visit. She has type 2 diabetes mellitus. Pertinent negatives for diabetes include no blurred vision, no chest pain, no polydipsia, no polyphagia and no polyuria. Risk factors for coronary artery disease include dyslipidemia, obesity, sedentary lifestyle and hypertension. Current diabetic treatment includes diet. When asked about meal planning, she reported none. She rarely participates in exercise. Her breakfast blood glucose range is generally 110-130 mg/dl. An ACE inhibitor/angiotensin II receptor blocker is being taken. Eye exam is current.  Hypertension This is a chronic problem. The current episode started more than 1 year ago. The problem has been gradually worsening since onset. The problem is uncontrolled. Pertinent negatives include no blurred vision or chest pain. Past treatments include ACE inhibitors and diuretics.  sAYs she  Avoid salt. No regular exercise because of back problems.     Review of Systems  Eyes: Negative for blurred vision.  Cardiovascular: Negative for chest pain.  Genitourinary: Negative for polyuria.  Hematological: Negative for polydipsia and polyphagia.       Objective:   Physical Exam  Constitutional: She is oriented to person, place, and time. She appears well-developed and well-nourished.  HENT:  Head: Normocephalic.  Neck: Neck supple. No thyromegaly present.  Cardiovascular: Normal rate, regular rhythm and normal heart sounds.        No coartid bruits.   Pulmonary/Chest: Effort normal and breath sounds normal.  Lymphadenopathy:    She has no cervical adenopathy.  Neurological: She is alert and oriented to person, place, and time.  Skin: Skin is warm and dry.  Psychiatric: She has a normal mood and affect.          Assessment & Plan:

## 2010-12-18 NOTE — Assessment & Plan Note (Signed)
BP not well controlled. Will inc metoprolol to 100mg  bid and recheck in 4-6 weeks.  Mointor HR.

## 2010-12-18 NOTE — Assessment & Plan Note (Signed)
Samples of spiriva and symbicort given.She has difficulty affording these.

## 2010-12-19 ENCOUNTER — Telehealth: Payer: Self-pay | Admitting: Family Medicine

## 2010-12-19 LAB — BASIC METABOLIC PANEL WITH GFR
BUN: 18 mg/dL (ref 6–23)
CO2: 23 mEq/L (ref 19–32)
Chloride: 103 mEq/L (ref 96–112)
Creat: 1.09 mg/dL (ref 0.40–1.20)
GFR, Est Non African American: 50 mL/min — ABNORMAL LOW (ref >60)

## 2010-12-19 NOTE — Telephone Encounter (Signed)
Call pt: BMP is normal except kidney are a little elevated but they are stable. No changes.

## 2010-12-19 NOTE — Telephone Encounter (Signed)
Left message on pt vm. 

## 2011-01-15 ENCOUNTER — Ambulatory Visit (INDEPENDENT_AMBULATORY_CARE_PROVIDER_SITE_OTHER): Payer: Medicare Other | Admitting: Family Medicine

## 2011-01-15 ENCOUNTER — Encounter: Payer: Self-pay | Admitting: Family Medicine

## 2011-01-15 VITALS — BP 128/67 | HR 66 | Ht 66.5 in | Wt 196.0 lb

## 2011-01-15 DIAGNOSIS — I10 Essential (primary) hypertension: Secondary | ICD-10-CM

## 2011-01-15 MED ORDER — METOPROLOL TARTRATE 100 MG PO TABS: 100.0000 mg | ORAL_TABLET | Freq: Two times a day (BID) | ORAL | Status: DC

## 2011-01-15 NOTE — Progress Notes (Signed)
  Subjective:    Patient ID: Charlotte Brewer, female    DOB: 04/26/1941, 70 y.o.   MRN: [id number]  Hypertension This is a chronic problem. The current episode started more than 1 year ago. The problem has been gradually improving since onset. The problem is controlled. Pertinent negatives include no blurred vision, headaches or shortness of breath. Agents associated with hypertension include NSAIDs and decongestants. Past treatments include ACE inhibitors, diuretics and beta blockers. The current treatment provides mild improvement. There are no compliance problems.       Review of Systems  Eyes: Negative for blurred vision.  Respiratory: Negative for shortness of breath.   Neurological: Negative for headaches.       Objective:   Physical Exam  Constitutional: She is oriented to person, place, and time. She appears well-developed and well-nourished.  HENT:  Head: Normocephalic and atraumatic.       Wears glasses.   Cardiovascular: Normal rate, regular rhythm and normal heart sounds.   Pulmonary/Chest: Effort normal and breath sounds normal.  Musculoskeletal: She exhibits no edema.  Neurological: She is alert and oriented to person, place, and time.  Skin: Skin is warm and dry.  Psychiatric: She has a normal mood and affect.          Assessment & Plan:

## 2011-01-15 NOTE — Assessment & Plan Note (Signed)
Finally well controlled on new regimen. Doing well on the inc betablocker without SE.  F.U in 2 months for Diabetes.

## 2011-01-15 NOTE — Patient Instructions (Signed)
Try to get your eye exam this year.

## 2011-03-26 ENCOUNTER — Encounter: Payer: Self-pay | Admitting: Family Medicine

## 2011-03-26 ENCOUNTER — Ambulatory Visit (INDEPENDENT_AMBULATORY_CARE_PROVIDER_SITE_OTHER): Payer: Medicare Other | Admitting: Family Medicine

## 2011-03-26 ENCOUNTER — Ambulatory Visit
Admission: RE | Admit: 2011-03-26 | Discharge: 2011-03-26 | Disposition: A | Discharge status: Home / Self Care | Payer: Medicare Other | Source: Ambulatory Visit | Attending: Family Medicine | Admitting: Family Medicine

## 2011-03-26 VITALS — BP 121/74 | HR 74 | Wt 194.0 lb

## 2011-03-26 DIAGNOSIS — Z23 Encounter for immunization: Secondary | ICD-10-CM

## 2011-03-26 DIAGNOSIS — Z1231 Encounter for screening mammogram for malignant neoplasm of breast: Secondary | ICD-10-CM

## 2011-03-26 DIAGNOSIS — E119 Type 2 diabetes mellitus without complications: Secondary | ICD-10-CM

## 2011-03-26 DIAGNOSIS — J449 Chronic obstructive pulmonary disease, unspecified: Secondary | ICD-10-CM

## 2011-03-26 MED ORDER — ALBUTEROL SULFATE HFA 108 (90 BASE) MCG/ACT IN AERS: 2.0000 | INHALATION_SPRAY | Freq: Four times a day (QID) | RESPIRATORY_TRACT | Status: DC | PRN

## 2011-03-26 NOTE — Progress Notes (Signed)
  Subjective:    Patient ID: Charlotte Brewer, female    DOB: 12-Mar-1941, 70 y.o.   MRN: [id number]  Diabetes She presents for her follow-up diabetic visit. She has type 2 diabetes mellitus. There are no hypoglycemic associated symptoms. Associated symptoms include visual change. Pertinent negatives for diabetes include no polydipsia and no polyuria. There are no hypoglycemic complications. Symptoms are stable. She is following a generally healthy diet. She rarely participates in exercise. Her breakfast blood glucose range is generally 110-130 mg/dl. An ACE inhibitor/angiotensin II receptor blocker is being taken. Eye exam is not current.   COPD- Says the spiriva really makes her feel better. She is as compliant with her meds as can be. She has diffuculty affording her symbicort adn spiriva so we try to give her samples when we can .She is doing well . She has not had any recent flares. She was recently exposed to her brother who was diagnosed with pneumonia. She due to small amount when she said goodbye. She says she was on antibiotics at the time but she is worried that she might develop pneumonia.   Review of Systems  Genitourinary: Negative for polyuria.  Hematological: Negative for polydipsia.       Objective:   Physical Exam  Constitutional: She is oriented to person, place, and time. She appears well-developed and well-nourished.  HENT:  Head: Normocephalic and atraumatic.  Neck: Neck supple. No thyromegaly present.  Cardiovascular: Normal rate, regular rhythm and normal heart sounds.        No carotid bruits  Pulmonary/Chest: Effort normal. She has no wheezes. She has no rales.       Prolonged expiration  Musculoskeletal: She exhibits no edema.  Lymphadenopathy:    She has no cervical adenopathy.  Neurological: She is alert and oriented to person, place, and time.  Skin: Skin is warm and dry.  Psychiatric: She has a normal mood and affect. Her behavior is normal.            Assessment & Plan:

## 2011-03-26 NOTE — Assessment & Plan Note (Signed)
Overall his COPD has been well-controlled on her current regimen. We did try to give her some samples today. I think she is doing great with the addition of Spiriva. She is to call if she gets any symptoms of cough or pneumonia. At this point I reassured her that her exam is at its baseline.

## 2011-03-26 NOTE — Assessment & Plan Note (Addendum)
Lab Results  Component Value Date   HGBA1C 6.3 03/26/2011  Doing fantastic. Keep up th egood work.. She was unable to give a urine sample today. F/U in 4 mo. Givne flu vaccine today.Overdue for cholesterol check. Given lab slip to check her cholesterol. Foot exam done today. I reminded her once again to get her eye exam.

## 2011-03-26 NOTE — Patient Instructions (Addendum)
Please schedule your yearly eye exam.  Keep up the good work!!!

## 2011-03-27 ENCOUNTER — Telehealth: Payer: Self-pay | Admitting: Family Medicine

## 2011-03-27 LAB — LIPID PANEL
Cholesterol: 134 mg/dL (ref 0–200)
HDL: 53 mg/dL (ref >39)
Triglycerides: 144 mg/dL (ref <150)
VLDL: 29 mg/dL (ref 0–40)

## 2011-03-27 NOTE — Telephone Encounter (Signed)
Pt informed that her cholesterol looks great and to repeat in 1 year.  Pt instructed to continue her medication for this problem. Jarvis Newcomer, LPN [facility]

## 2011-03-27 NOTE — Telephone Encounter (Signed)
Call patient: Cholesterol looks fantastic. Continue the medication. Recheck in one year.

## 2011-03-28 ENCOUNTER — Telehealth: Payer: Self-pay | Admitting: Family Medicine

## 2011-03-28 NOTE — Telephone Encounter (Signed)
Please call patient. Normal mammogram.  Repeat in 1 year.  

## 2011-04-02 NOTE — Telephone Encounter (Signed)
Pt.notified

## 2011-04-10 ENCOUNTER — Other Ambulatory Visit: Payer: Self-pay | Admitting: Family Medicine

## 2011-05-06 ENCOUNTER — Ambulatory Visit
Admission: RE | Admit: 2011-05-06 | Discharge: 2011-05-06 | Disposition: A | Discharge status: Home / Self Care | Payer: Medicare Other | Source: Ambulatory Visit | Attending: Orthopedic Surgery | Admitting: Orthopedic Surgery

## 2011-05-06 ENCOUNTER — Other Ambulatory Visit: Payer: Self-pay | Admitting: Orthopedic Surgery

## 2011-05-06 DIAGNOSIS — Z981 Arthrodesis status: Secondary | ICD-10-CM

## 2011-05-22 ENCOUNTER — Telehealth: Payer: Self-pay | Admitting: Family Medicine

## 2011-05-22 NOTE — Telephone Encounter (Signed)
Pt called C/O painful urination X 4 days. Plan:  Nurse visit scheduled for 05-23-11. Jarvis Newcomer, LPN [facility]

## 2011-05-23 ENCOUNTER — Other Ambulatory Visit (INDEPENDENT_AMBULATORY_CARE_PROVIDER_SITE_OTHER): Payer: Medicare Other

## 2011-05-23 DIAGNOSIS — N390 Urinary tract infection, site not specified: Secondary | ICD-10-CM

## 2011-05-23 NOTE — Progress Notes (Signed)
  Subjective:    Patient ID: Charlotte Brewer, female    DOB: [birth year], 70 y.o.   MRN: [id number]  HPI Urine not run     Review of Systems     Objective:   Physical Exam        Assessment & Plan:  Pt not seen.

## 2011-05-24 ENCOUNTER — Telehealth: Payer: Self-pay | Admitting: Family Medicine

## 2011-05-24 NOTE — Telephone Encounter (Signed)
Pt called and said she came in for nurse visit yesterday and was told to go back and get UA and she left it in the silver door in the bathroom without a name or dob and no clinical person knew it was there.  Pt calls in today to get her UA results and unfortunately UA not ran because no one knew it was there.   Plan:  Pt instructed to come in for nurse visit Monday and see triage nurse to repeat UA. Jarvis Newcomer, LPN [facility]

## 2011-05-27 ENCOUNTER — Other Ambulatory Visit (INDEPENDENT_AMBULATORY_CARE_PROVIDER_SITE_OTHER): Payer: Medicare Other | Admitting: Family Medicine

## 2011-05-27 ENCOUNTER — Telehealth: Payer: Self-pay | Admitting: Family Medicine

## 2011-05-27 VITALS — BP 140/74 | HR 66 | Resp 20 | Ht 62.0 in | Wt 196.0 lb

## 2011-05-27 DIAGNOSIS — R300 Dysuria: Secondary | ICD-10-CM

## 2011-05-27 LAB — POCT URINALYSIS DIPSTICK
Bilirubin, UA: NEGATIVE
Glucose, UA: NEGATIVE
Nitrite, UA: NEGATIVE
pH, UA: 5.5

## 2011-05-27 MED ORDER — SULFAMETHOXAZOLE-TMP DS 800-160 MG PO TABS: 1.0000 | ORAL_TABLET | Freq: Two times a day (BID) | ORAL | Status: DC

## 2011-05-27 NOTE — Telephone Encounter (Signed)
Closed.  Rx was sent to the pharmacy as ordered. Jarvis Newcomer, LPN [facility]

## 2011-05-27 NOTE — Patient Instructions (Addendum)
1.  Will treat for UTI with Bactrim DS (1) PO BID X 3 days. 2.  Pt instructed to continue cranberry pills, and water intake. 3.  Instructed to wear cotton panties. 4.  Told to call back if no improvement after Ab Tx completed. 5.  May use tylenol Q4-6 hours PRN for pain. Jarvis Newcomer, LPN [facility]

## 2011-05-27 NOTE — Progress Notes (Signed)
  Subjective:    Patient ID: Charlotte Brewer, female    DOB: [birth year], 70 y.o.   MRN: [id number]  HPI Pt presents for nurse visit today for urinalysis.  Pt has been experiencing LBP, burning, freq urination for > 1 week.  Had left a specimen last week but it was left in the silver door and not checked.  Pt instructed to come back in since that specimen was not any good because left sitting in door.   Jarvis Newcomer, LPN [facility]      Review of Systems     Objective:   Physical Exam        Assessment & Plan:  UTI - will treat with Bactrim DS one tab twice a day x3 days #6 tabs with no refills. If her symptoms are not completely resolved in 3-4 days please contact the office.

## 2011-06-07 ENCOUNTER — Ambulatory Visit: Payer: Medicare Other | Admitting: Family Medicine

## 2011-06-27 ENCOUNTER — Other Ambulatory Visit: Payer: Self-pay | Admitting: Family Medicine

## 2011-07-17 ENCOUNTER — Encounter: Payer: Self-pay | Admitting: Family Medicine

## 2011-07-26 ENCOUNTER — Encounter: Payer: Self-pay | Admitting: Family Medicine

## 2011-07-26 ENCOUNTER — Ambulatory Visit (INDEPENDENT_AMBULATORY_CARE_PROVIDER_SITE_OTHER): Payer: Medicare Other | Admitting: Family Medicine

## 2011-07-26 DIAGNOSIS — E119 Type 2 diabetes mellitus without complications: Secondary | ICD-10-CM

## 2011-07-26 DIAGNOSIS — M7989 Other specified soft tissue disorders: Secondary | ICD-10-CM

## 2011-07-26 LAB — POCT UA - MICROALBUMIN

## 2011-07-26 NOTE — Patient Instructions (Signed)
Remember to get your eye exam.

## 2011-07-26 NOTE — Progress Notes (Signed)
  Subjective:    Patient ID: Charlotte Brewer, female    DOB: November 14, 1940, 70 y.o.   MRN: [id number]  Diabetes She presents for her follow-up diabetic visit. She has type 2 diabetes mellitus. Her disease course has been stable. Pertinent negatives for diabetes include no foot paresthesias, no foot ulcerations, no polydipsia, no polyphagia and no polyuria. Current diabetic treatment includes diet. She is compliant with treatment all of the time. Her weight is stable. She rarely participates in exercise. There is no change in her home blood glucose trend. Her breakfast blood glucose range is generally 110-130 mg/dl. An ACE inhibitor/angiotensin II receptor blocker is being taken. She does not see a podiatrist.  Thinks may have gout. Rt great toe. REd and swelling intermittantly just once. Says occurrd about 2-3 months ago. Lasted 3 weeks.  Brother has gout. Now resolved. Says was very painful.    Review of Systems  Genitourinary: Negative for polyuria.  Hematological: Negative for polydipsia and polyphagia.       Objective:   Physical Exam  Constitutional: She is oriented to person, place, and time. She appears well-developed and well-nourished.  HENT:  Head: Normocephalic and atraumatic.  Cardiovascular: Normal rate, regular rhythm and normal heart sounds.   Pulmonary/Chest: Effort normal and breath sounds normal.  Musculoskeletal:       Rt great toe has a bunion that is mildly erythematous. No swelling of the active joint. NROM. Non tender today.   Neurological: She is alert and oriented to person, place, and time.  Skin: Skin is warm and dry.  Psychiatric: She has a normal mood and affect. Her behavior is normal.          Assessment & Plan:  Right great toe - REd and swelling intermittantly just once. Says occurrd about 2-3 months ago. Lasted 3 weeks. Brother has gout. Will check a uric acid level. Not swollen today.   DM- A1C looks great. At goal. Repeat urine micro is normal today.   Does have CKD3, on ACE. BP well controlled. Still smoking. Not interested in quitting at this time. She is saving up to get her eye exam.  Lab Results  Component Value Date   HGBA1C 6.4 07/26/2011

## 2011-07-27 LAB — COMPLETE METABOLIC PANEL WITH GFR
AST: 14 U/L (ref 0–37)
BUN: 38 mg/dL — ABNORMAL HIGH (ref 6–23)
Calcium: 9.5 mg/dL (ref 8.4–10.5)
Chloride: 106 mEq/L (ref 96–112)
Creat: 1.29 mg/dL — ABNORMAL HIGH (ref 0.50–1.10)
GFR, Est African American: 49 mL/min — ABNORMAL LOW
Total Bilirubin: 0.6 mg/dL (ref 0.3–1.2)

## 2011-07-27 LAB — URIC ACID: Uric Acid, Serum: 11.6 mg/dL — ABNORMAL HIGH (ref 2.4–7.0)

## 2011-07-29 ENCOUNTER — Other Ambulatory Visit: Payer: Self-pay | Admitting: Family Medicine

## 2011-07-29 MED ORDER — COLCHICINE 0.6 MG PO TABS: 0.6000 mg | ORAL_TABLET | Freq: Every day | ORAL | Status: DC

## 2011-07-29 MED ORDER — LISINOPRIL 40 MG PO TABS: 40.0000 mg | ORAL_TABLET | Freq: Every day | ORAL | Status: DC

## 2011-07-29 MED ORDER — FEBUXOSTAT 40 MG PO TABS: 40.0000 mg | ORAL_TABLET | Freq: Every day | ORAL | Status: AC

## 2011-08-14 ENCOUNTER — Encounter: Payer: Self-pay | Admitting: Family Medicine

## 2011-08-26 ENCOUNTER — Other Ambulatory Visit: Payer: Self-pay | Admitting: *Deleted

## 2011-08-26 MED ORDER — TIOTROPIUM BROMIDE MONOHYDRATE 18 MCG IN CAPS: 18.0000 ug | ORAL_CAPSULE | Freq: Every day | RESPIRATORY_TRACT | Status: AC

## 2011-09-17 ENCOUNTER — Ambulatory Visit (INDEPENDENT_AMBULATORY_CARE_PROVIDER_SITE_OTHER): Payer: Medicare Other | Admitting: Family Medicine

## 2011-09-17 ENCOUNTER — Encounter: Payer: Self-pay | Admitting: Family Medicine

## 2011-09-17 VITALS — BP 121/70 | HR 61 | Wt 195.0 lb

## 2011-09-17 DIAGNOSIS — R600 Localized edema: Secondary | ICD-10-CM

## 2011-09-17 DIAGNOSIS — R609 Edema, unspecified: Secondary | ICD-10-CM

## 2011-09-17 MED ORDER — SULFAMETHOXAZOLE-TRIMETHOPRIM 800-160 MG PO TABS: 1.0000 | ORAL_TABLET | Freq: Two times a day (BID) | ORAL | Status: AC

## 2011-09-17 NOTE — Progress Notes (Signed)
  Subjective:    Patient ID: Charlotte Brewer, female    DOB: [birth year], 71 y.o.   MRN: [id number]  HPI Both legs are swollen from the knees down.  Started about 2-3 weeks ago. Had blisters initially. Has gotten a little better.  Was in hosp for 8 days about 7 years ago for cellulitis.  This looks similar.  No change in salt intake. Not on any fluids pill. Has been more SOB since it started. No CP.  Initially it burned and itched and hurts. Has been scratching it. We stopped her fluid pill in December. Hx of COPD and DM.    Review of Systems     Objective:   Physical Exam  Constitutional: She appears well-developed and well-nourished.  HENT:  Head: Normocephalic and atraumatic.  Cardiovascular: Normal rate, regular rhythm and normal heart sounds.   Musculoskeletal:       Lower extremities with 2+ edema from the knees down. She has some prolonged changes over the shins bilaterally. She also has some large scabs scattered on both legs. They appeared to be healing. No active drainage or pus.  Skin: Skin is warm and dry.          Assessment & Plan:  Lower extremity edema- unclear etiology and trigger. Certainly part of this could be cellulitis but would be very unusual to be on both legs and to be symmetric. I suspect is an element of venous stasis here. We did stop her fluid pill 2 months ago  bc renal funciton inc.  Uncertain she could be accumulating fluid. I reviewed with her eating a low salt diet. I will go ahead and place her on Bactrim twice a day for 10 days to see if this clears up. If not then please return to the office. We'll also check a CMP, TSH and CBC today. Also consider compression stockings if the swelling does not resolve with low-salt diet and antibiotic.

## 2011-09-18 LAB — COMPLETE METABOLIC PANEL WITH GFR
ALT: 11 U/L (ref 0–35)
Alkaline Phosphatase: 74 U/L (ref 39–117)
Sodium: 143 mEq/L (ref 135–145)
Total Bilirubin: 0.8 mg/dL (ref 0.3–1.2)
Total Protein: 7.3 g/dL (ref 6.0–8.3)

## 2011-09-18 LAB — CBC WITH DIFFERENTIAL/PLATELET
Basophils Relative: 0 % (ref 0–1)
Eosinophils Absolute: 0.3 10*3/uL (ref 0.0–0.7)
HCT: 52.5 % — ABNORMAL HIGH (ref 36.0–46.0)
Hemoglobin: 16.6 g/dL — ABNORMAL HIGH (ref 12.0–15.0)
MCH: 30.9 pg (ref 26.0–34.0)
MCHC: 31.6 g/dL (ref 30.0–36.0)
MCV: 97.6 fL (ref 78.0–100.0)
Monocytes Absolute: 0.7 10*3/uL (ref 0.1–1.0)
Monocytes Relative: 7 % (ref 3–12)

## 2011-09-25 ENCOUNTER — Other Ambulatory Visit: Payer: Self-pay | Admitting: Family Medicine

## 2011-10-17 ENCOUNTER — Encounter: Payer: Self-pay | Admitting: *Deleted

## 2011-10-23 ENCOUNTER — Ambulatory Visit (INDEPENDENT_AMBULATORY_CARE_PROVIDER_SITE_OTHER): Payer: Medicare Other | Admitting: Family Medicine

## 2011-10-23 ENCOUNTER — Encounter: Payer: Self-pay | Admitting: Family Medicine

## 2011-10-23 DIAGNOSIS — I10 Essential (primary) hypertension: Secondary | ICD-10-CM

## 2011-10-23 DIAGNOSIS — I878 Other specified disorders of veins: Secondary | ICD-10-CM

## 2011-10-23 DIAGNOSIS — E669 Obesity, unspecified: Secondary | ICD-10-CM

## 2011-10-23 DIAGNOSIS — I872 Venous insufficiency (chronic) (peripheral): Secondary | ICD-10-CM

## 2011-10-23 DIAGNOSIS — M25476 Effusion, unspecified foot: Secondary | ICD-10-CM

## 2011-10-23 DIAGNOSIS — E119 Type 2 diabetes mellitus without complications: Secondary | ICD-10-CM

## 2011-10-23 DIAGNOSIS — R232 Flushing: Secondary | ICD-10-CM

## 2011-10-23 DIAGNOSIS — M25473 Effusion, unspecified ankle: Secondary | ICD-10-CM

## 2011-10-23 MED ORDER — METOPROLOL TARTRATE 100 MG PO TABS: 100.0000 mg | ORAL_TABLET | Freq: Two times a day (BID) | ORAL | Status: DC

## 2011-10-23 MED ORDER — TRIAMCINOLONE ACETONIDE 0.5 % EX OINT: TOPICAL_OINTMENT | Freq: Every day | CUTANEOUS | Status: DC | PRN

## 2011-10-23 MED ORDER — LISINOPRIL 40 MG PO TABS: 40.0000 mg | ORAL_TABLET | Freq: Every day | ORAL | Status: DC

## 2011-10-23 NOTE — Progress Notes (Signed)
Addended by: Avon Gully C on: 10/23/2011 11:12 AM   Modules accepted: Orders

## 2011-10-23 NOTE — Progress Notes (Signed)
  Subjective:    Patient ID: Charlotte Brewer, female    DOB: Jul 01, 1941, 71 y.o.   MRN: [id number]  Diabetes She presents for her follow-up diabetic visit. She has type 2 diabetes mellitus. Her disease course has been stable. Associated symptoms include foot paresthesias. Pertinent negatives for diabetes include no blurred vision, no foot ulcerations, no polydipsia, no polyphagia and no polyuria. She is compliant with treatment all of the time. She rarely participates in exercise. There is no change in her home blood glucose trend. Her breakfast blood glucose range is generally 110-130 mg/dl. An ACE inhibitor/angiotensin II receptor blocker is being taken. Eye exam is not current.   Gout-she stopped her colchicine and Uloric because she was feeling short of breath her medications. She said she read the side effects on the medication and it was listed. + hx of COPD.    Review of Systems  Eyes: Negative for blurred vision.  Genitourinary: Negative for polyuria.  Hematological: Negative for polydipsia and polyphagia.       Objective:   Physical Exam  Constitutional: She is oriented to person, place, and time. She appears well-developed and well-nourished.  HENT:  Head: Normocephalic and atraumatic.  Cardiovascular: Normal rate, regular rhythm and normal heart sounds.   Pulmonary/Chest: Effort normal and breath sounds normal.  Musculoskeletal:       1+ edema from knees to foot, bilat and erythema. Excoriatoins on the shins. Few scattered scabs.   Neurological: She is alert and oriented to person, place, and time.  Skin: Skin is warm and dry.  Psychiatric: She has a normal mood and affect. Her behavior is normal.          Assessment & Plan:  DM- diabetes the fantastic. She is doing great. Continue current regimen. I did encourage her to get a little bit more exercise such as the weather is getting nicer. This would also be good for her COPD. Lab Results  Component Value Date   HGBA1C  6.4 07/26/2011  Hasn't been able to afford eye exam yet but will try to get it this year   Leg swelling with erythema - Can palpate DP pulses but faint on right foot.  Will schedule for ABIs fo further eval.  If normal then rx for compression stockings.    Hypertension-her blood pressure looks good today, the most low. She is tolerating it well without any dizziness. She was actually supposed to discontinue her lisinopril HCT and switch to just plain lisinopril. Unfortunately she did not pick up the new prescription. I explained to her that the HCTZ was trying her kidneys out too much. She will discard that prescription today and  pick up the new one  At [facility] pharmacy. I also refilled her metoprolol for a year.   Gout - I explained to her that we definitely need to retry the report for her gout. She says one of her medications was causing some increased shortness of breath. She wasn't sure if it was the Uloric or the colchicine so she stopped both. I asked her to retry the Uloric. Her last uric acid was over 11.

## 2011-10-25 ENCOUNTER — Ambulatory Visit: Payer: Medicare Other | Admitting: Family Medicine

## 2011-11-15 LAB — BASIC METABOLIC PANEL
CO2: 26 mEq/L (ref 19–32)
Calcium: 8.9 mg/dL (ref 8.4–10.5)
Creat: 0.88 mg/dL (ref 0.50–1.10)
Glucose, Bld: 114 mg/dL — ABNORMAL HIGH (ref 70–99)
Sodium: 143 mEq/L (ref 135–145)

## 2011-11-15 NOTE — Progress Notes (Signed)
Quick Note:  All labs are normal. ______ 

## 2011-12-19 ENCOUNTER — Encounter: Payer: Self-pay | Admitting: Family Medicine

## 2011-12-19 ENCOUNTER — Ambulatory Visit (INDEPENDENT_AMBULATORY_CARE_PROVIDER_SITE_OTHER): Payer: Medicare Other | Admitting: Family Medicine

## 2011-12-19 ENCOUNTER — Telehealth: Payer: Self-pay | Admitting: Family Medicine

## 2011-12-19 VITALS — BP 87/55 | HR 69 | Temp 98.3°F | Ht 63.0 in | Wt 197.0 lb

## 2011-12-19 DIAGNOSIS — R81 Glycosuria: Secondary | ICD-10-CM

## 2011-12-19 DIAGNOSIS — R3129 Other microscopic hematuria: Secondary | ICD-10-CM

## 2011-12-19 DIAGNOSIS — I959 Hypotension, unspecified: Secondary | ICD-10-CM

## 2011-12-19 DIAGNOSIS — N644 Mastodynia: Secondary | ICD-10-CM

## 2011-12-19 DIAGNOSIS — R310 Gross hematuria: Secondary | ICD-10-CM

## 2011-12-19 LAB — POCT URINALYSIS DIPSTICK
Blood, UA: NEGATIVE
Spec Grav, UA: 1.02
Urobilinogen, UA: >=8

## 2011-12-19 MED ORDER — FUROSEMIDE 20 MG PO TABS: 20.0000 mg | ORAL_TABLET | Freq: Every day | ORAL | Status: DC

## 2011-12-19 NOTE — Patient Instructions (Signed)
WE will call you with the referral for your breast

## 2011-12-19 NOTE — Progress Notes (Signed)
  Subjective:    Patient ID: Charlotte Brewer, female    DOB: 1941/03/02, 71 y.o.   MRN: [id number]  HPI Pain the right breast. Pain started latarlly and is now more medially. No trauam. No swelling, redness or rash.  Can still wear a bra.  Last mammo 02/2011. Worse when you cough.  No meds or pain reliever. No fever.  Hx of COPd, no recent change in cough or signs of URI.    She has hx of chronic hematuria, but lately has noticed some gross hematuria. She says when she slipped a couple times she seemed a little paper and last week when she was at [employer] she went to the restroom area noticed blood in the toilet. She denies any dysuria or fever or recent onset back pain. + smoker.   Review of Systems     Objective:   Physical Exam  Constitutional: She appears well-developed and well-nourished.  HENT:  Head: Normocephalic and atraumatic.  Cardiovascular: Normal rate, regular rhythm and normal heart sounds.   Pulmonary/Chest: Effort normal.       Coarse breath sounds bilaterally. Right and left breast with normal exam. There she was tender on the right breast. Or erythema rashes or dimpling of the skin. No swelling. No significant asymmetry. No axillary lymphadenopathy.  Skin: Skin is warm and dry.  Psychiatric: She has a normal mood and affect. Her behavior is normal.          Assessment & Plan:  Right breast pain -even if she had a normal screening mammogram in August I will like to set her up for diagnostic and possibly ultrasound if needed. She prefers to stay here or in [place] if possible. For now she can take Tylenol/ Motrin as needed for pain relief and make sure wearing a good fitting and supportive bra. This may also just be ligament strain of the breast tissue. Consider costochondritis but the actual soft tissue of her breast is tender.    Gross hematuria-we will send urine for culture. The urinalysis was negative for infection but was positive for blood, bilirubin and  glucose. We'll also check a hemoglobin A1c she is diabetic, though normally she is well controlled.  She is a smoker so consider further workup by urology to rule out bladder cancer. She also had bilirubin in her urine. We'll check her liver enzymes today. Send urine for culture to rule out infection. There she's not having any systemic signs of infection. Lab Results  Component Value Date   HGBA1C 6.2 10/23/2011    Hypotension.  Cut lisinopril in half.  Hold lasix for 2 days, then recheck BP on Monday.

## 2011-12-19 NOTE — Telephone Encounter (Signed)
Call pt: Cut lisinopril in half.  Hold lasix for 2 days, then recheck BP on Monday.

## 2011-12-19 NOTE — Telephone Encounter (Signed)
Pt.notified

## 2011-12-20 ENCOUNTER — Ambulatory Visit
Admission: RE | Admit: 2011-12-20 | Discharge: 2011-12-20 | Disposition: A | Discharge status: Home / Self Care | Payer: Medicare Other | Source: Ambulatory Visit | Attending: Family Medicine | Admitting: Family Medicine

## 2011-12-20 ENCOUNTER — Ambulatory Visit (INDEPENDENT_AMBULATORY_CARE_PROVIDER_SITE_OTHER): Payer: Medicare Other | Admitting: Family Medicine

## 2011-12-20 ENCOUNTER — Telehealth: Payer: Self-pay | Admitting: *Deleted

## 2011-12-20 ENCOUNTER — Other Ambulatory Visit: Payer: Self-pay | Admitting: Family Medicine

## 2011-12-20 VITALS — BP 103/55 | HR 79

## 2011-12-20 DIAGNOSIS — J449 Chronic obstructive pulmonary disease, unspecified: Secondary | ICD-10-CM

## 2011-12-20 DIAGNOSIS — I959 Hypotension, unspecified: Secondary | ICD-10-CM

## 2011-12-20 DIAGNOSIS — R05 Cough: Secondary | ICD-10-CM

## 2011-12-20 DIAGNOSIS — J189 Pneumonia, unspecified organism: Secondary | ICD-10-CM

## 2011-12-20 LAB — CBC WITH DIFFERENTIAL/PLATELET
Basophils Absolute: 0 10*3/uL (ref 0.0–0.1)
Basophils Relative: 0 % (ref 0–1)
Lymphocytes Relative: 9 % — ABNORMAL LOW (ref 12–46)
Neutro Abs: 17.7 10*3/uL — ABNORMAL HIGH (ref 1.7–7.7)
Neutrophils Relative %: 84 % — ABNORMAL HIGH (ref 43–77)
Platelets: 240 10*3/uL (ref 150–400)
RDW: 14.6 % (ref 11.5–15.5)
WBC: 21.2 10*3/uL — ABNORMAL HIGH (ref 4.0–10.5)

## 2011-12-20 LAB — COMPLETE METABOLIC PANEL WITH GFR
Albumin: 2.6 g/dL — ABNORMAL LOW (ref 3.5–5.2)
Alkaline Phosphatase: 111 U/L (ref 39–117)
BUN: 17 mg/dL (ref 6–23)
CO2: 25 mEq/L (ref 19–32)
Calcium: 8.5 mg/dL (ref 8.4–10.5)
GFR, Est African American: 63 mL/min
GFR, Est Non African American: 54 mL/min — ABNORMAL LOW
Glucose, Bld: 112 mg/dL — ABNORMAL HIGH (ref 70–99)
Potassium: 4 mEq/L (ref 3.5–5.3)

## 2011-12-20 LAB — HEMOGLOBIN A1C: Mean Plasma Glucose: 140 mg/dL — ABNORMAL HIGH (ref <117)

## 2011-12-20 MED ORDER — LEVOFLOXACIN 750 MG PO TABS: 750.0000 mg | ORAL_TABLET | Freq: Every day | ORAL | Status: AC

## 2011-12-20 MED ORDER — SULFAMETHOXAZOLE-TRIMETHOPRIM 800-160 MG PO TABS: 1.0000 | ORAL_TABLET | Freq: Two times a day (BID) | ORAL | Status: DC

## 2011-12-20 NOTE — Telephone Encounter (Signed)
Pt notified of MD instructions. KJ LPN 

## 2011-12-20 NOTE — Progress Notes (Addendum)
  Subjective:    Patient ID: Charlotte Brewer, female    DOB: [birth year], 71 y.o.   MRN: [id number] BP check. HPI    Review of Systems     Objective:   Physical Exam        Assessment & Plan:  Hypotension - BP is better today.  CXR done today shows PNA. Make sure drinking plenty of fluids.   Cipriano Bunker, MD

## 2011-12-21 LAB — URINE CULTURE: Colony Count: 40000

## 2012-01-24 ENCOUNTER — Encounter: Payer: Self-pay | Admitting: *Deleted

## 2012-01-27 ENCOUNTER — Ambulatory Visit: Payer: Medicare Other | Admitting: Family Medicine

## 2012-02-05 ENCOUNTER — Encounter: Payer: Self-pay | Admitting: Physician Assistant

## 2012-02-05 ENCOUNTER — Ambulatory Visit (INDEPENDENT_AMBULATORY_CARE_PROVIDER_SITE_OTHER): Payer: Medicare Other | Admitting: Physician Assistant

## 2012-02-05 VITALS — BP 135/69 | HR 58 | Ht 63.0 in | Wt 192.0 lb

## 2012-02-05 DIAGNOSIS — J189 Pneumonia, unspecified organism: Secondary | ICD-10-CM

## 2012-02-05 DIAGNOSIS — J449 Chronic obstructive pulmonary disease, unspecified: Secondary | ICD-10-CM

## 2012-02-05 NOTE — Patient Instructions (Addendum)
Reminder to make appt for yearly diabetic eye exam.   Follow up with Pulmonologist this afternoon.   Smoking Cessation This document explains the best ways for you to quit smoking and new treatments to help. It lists new medicines that can double or triple your chances of quitting and quitting for good. It also considers ways to avoid relapses and concerns you may have about quitting, including weight gain. NICOTINE: A POWERFUL ADDICTION If you have tried to quit smoking, you know how hard it can be. It is hard because nicotine is a very addictive drug. For some people, it can be as addictive as heroin or cocaine. Usually, people make 2 or 3 tries, or more, before finally being able to quit. Each time you try to quit, you can learn about what helps and what hurts. Quitting takes hard work and a lot of effort, but you can quit smoking. QUITTING SMOKING IS ONE OF THE MOST IMPORTANT THINGS YOU WILL EVER DO.  You will live longer, feel better, and live better.   The impact on your body of quitting smoking is felt almost immediately:   Within 20 minutes, blood pressure decreases. Pulse returns to its normal level.   After 8 hours, carbon monoxide levels in the blood return to normal. Oxygen level increases.   After 24 hours, chance of heart attack starts to decrease. Breath, hair, and body stop smelling like smoke.   After 48 hours, damaged nerve endings begin to recover. Sense of taste and smell improve.   After 72 hours, the body is virtually free of nicotine. Bronchial tubes relax and breathing becomes easier.   After 2 to 12 weeks, lungs can hold more air. Exercise becomes easier and circulation improves.   Quitting will reduce your risk of having a heart attack, stroke, cancer, or lung disease:   After 1 year, the risk of coronary heart disease is cut in half.   After 5 years, the risk of stroke falls to the same as a nonsmoker.   After 10 years, the risk of lung cancer is cut in half  and the risk of other cancers decreases significantly.   After 15 years, the risk of coronary heart disease drops, usually to the level of a nonsmoker.   If you are pregnant, quitting smoking will improve your chances of having a healthy baby.   The people you live with, especially your children, will be healthier.   You will have extra money to spend on things other than cigarettes.  FIVE KEYS TO QUITTING Studies have shown that these 5 steps will help you quit smoking and quit for good. You have the best chances of quitting if you use them together: 1. Get ready.  2. Get support and encouragement.  3. Learn new skills and behaviors.  4. Get medicine to reduce your nicotine addiction and use it correctly.  5. Be prepared for relapse or difficult situations. Be determined to continue trying to quit, even if you do not succeed at first.  1. GET READY  Set a quit date.   Change your environment.   Get rid of ALL cigarettes, ashtrays, matches, and lighters in your home, car, and place of work.   Do not let people smoke in your home.   Review your past attempts to quit. Think about what worked and what did not.   Once you quit, do not smoke. NOT EVEN A PUFF!  2. GET SUPPORT AND ENCOURAGEMENT Studies have shown that you have  a better chance of being successful if you have help. You can get support in many ways.  Tell your family, friends, and coworkers that you are going to quit and need their support. Ask them not to smoke around you.   Talk to your caregivers (doctor, dentist, nurse, pharmacist, psychologist, and/or smoking counselor).   Get individual, group, or telephone counseling and support. The more counseling you have, the better your chances are of quitting. Programs are available at [employer] and health centers. Call your local health department for information about programs in your area.   Spiritual beliefs and practices may help some smokers quit.   Quit meters  are Photographer that keep track of quit statistics, such as amount of "quit-time," cigarettes not smoked, and money saved.   Many smokers find one or more of the many self-help books available useful in helping them quit and stay off tobacco.  3. LEARN NEW SKILLS AND BEHAVIORS  Try to distract yourself from urges to smoke. Talk to someone, go for a walk, or occupy your time with a task.   When you first try to quit, change your routine. Take a different route to work. Drink tea instead of coffee. Eat breakfast in a different place.   Do something to reduce your stress. Take a hot bath, exercise, or read a book.   Plan something enjoyable to do every day. Reward yourself for not smoking.   Explore interactive web-based programs that specialize in helping you quit.  4. GET MEDICINE AND USE IT CORRECTLY Medicines can help you stop smoking and decrease the urge to smoke. Combining medicine with the above behavioral methods and support can quadruple your chances of successfully quitting smoking. The U[place]. Food and Drug Administration (FDA) has approved 7 medicines to help you quit smoking. These medicines fall into 3 categories.  Nicotine replacement therapy (delivers nicotine to your body without the negative effects and risks of smoking):   Nicotine gum: Available over-the-counter.   Nicotine lozenges: Available over-the-counter.   Nicotine inhaler: Available by prescription.   Nicotine nasal spray: Available by prescription.   Nicotine skin patches (transdermal): Available by prescription and over-the-counter.   Antidepressant medicine (helps people abstain from smoking, but how this works is unknown):   Bupropion sustained-release (SR) tablets: Available by prescription.   Nicotinic receptor partial agonist (simulates the effect of nicotine in your brain):   Varenicline tartrate tablets: Available by prescription.   Ask your caregiver for advice  about which medicines to use and how to use them. Carefully read the information on the package.   Everyone who is trying to quit may benefit from using a medicine. If you are pregnant or trying to become pregnant, nursing an infant, you are under age 69, or you smoke fewer than 10 cigarettes per day, talk to your caregiver before taking any nicotine replacement medicines.   You should stop using a nicotine replacement product and call your caregiver if you experience nausea, dizziness, weakness, vomiting, fast or irregular heartbeat, mouth problems with the lozenge or gum, or redness or swelling of the skin around the patch that does not go away.   Do not use any other product containing nicotine while using a nicotine replacement product.   Talk to your caregiver before using these products if you have diabetes, heart disease, asthma, stomach ulcers, you had a recent heart attack, you have high blood pressure that is not controlled with medicine, a history of irregular  heartbeat, or you have been prescribed medicine to help you quit smoking.  5. BE PREPARED FOR RELAPSE OR DIFFICULT SITUATIONS  Most relapses occur within the first 3 months after quitting. Do not be discouraged if you start smoking again. Remember, most people try several times before they finally quit.   You may have symptoms of withdrawal because your body is used to nicotine. You may crave cigarettes, be irritable, feel very hungry, cough often, get headaches, or have difficulty concentrating.   The withdrawal symptoms are only temporary. They are strongest when you first quit, but they will go away within 10 to 14 days.  Here are some difficult situations to watch for:  Alcohol. Avoid drinking alcohol. Drinking lowers your chances of successfully quitting.   Caffeine. Try to reduce the amount of caffeine you consume. It also lowers your chances of successfully quitting.   Other smokers. Being around smoking can make you  want to smoke. Avoid smokers.   Weight gain. Many smokers will gain weight when they quit, usually less than 10 pounds. Eat a healthy diet and stay active. Do not let weight gain distract you from your main goal, quitting smoking. Some medicines that help you quit smoking may also help delay weight gain. You can always lose the weight gained after you quit.   Bad mood or depression. There are a lot of ways to improve your mood other than smoking.  If you are having problems with any of these situations, talk to your caregiver. SPECIAL SITUATIONS AND CONDITIONS Studies suggest that everyone can quit smoking. Your situation or condition can give you a special reason to quit.  Pregnant women/new mothers: By quitting, you protect your baby's health and your own.   Hospitalized patients: By quitting, you reduce health problems and help healing.   Heart attack patients: By quitting, you reduce your risk of a second heart attack.   Lung, head, and neck cancer patients: By quitting, you reduce your chance of a second cancer.   Parents of children and adolescents: By quitting, you protect your children from illnesses caused by secondhand smoke.  QUESTIONS TO THINK ABOUT Think about the following questions before you try to stop smoking. You may want to talk about your answers with your caregiver.  Why do you want to quit?   If you tried to quit in the past, what helped and what did not?   What will be the most difficult situations for you after you quit? How will you plan to handle them?   Who can help you through the tough times? Your family? Friends? Caregiver?   What pleasures do you get from smoking? What ways can you still get pleasure if you quit?  Here are some questions to ask your caregiver:  How can you help me to be successful at quitting?   What medicine do you think would be best for me and how should I take it?   What should I do if I need more help?   What is smoking  withdrawal like? How can I get information on withdrawal?  Quitting takes hard work and a lot of effort, but you can quit smoking. FOR MORE INFORMATION  Smokefree.gov (http://www.davis-sullivan.com/) provides free, accurate, evidence-based information and professional assistance to help support the immediate and long-term needs of people trying to quit smoking. Document Released: 07/09/2001 Document Revised: 07/04/2011 Document Reviewed: 05/01/2009 [facility] Patient Information 2012 [place], [place].

## 2012-02-05 NOTE — Progress Notes (Signed)
  Subjective:    Patient ID: Charlotte Brewer, female    DOB: 11-30-1940, 71 y.o.   MRN: [id number]  HPI [facility] Prednisone, Sprivia, and symibort. Still uses albuterol inhaler once to twice a day and on prednisone and levaquin. She is feeling much better. She has appt with pulmonologist this afternoon for follow up. Dr. Jerre Simon. No cigarettes since 30th of June. Using all everyday inhalers.    Review of Systems     Objective:   Physical Exam  Constitutional: She is oriented to person, place, and time. She appears well-developed and well-nourished.       Smells of smoke despite reporting quiting 10 days ago. Obese.  HENT:  Head: Normocephalic and atraumatic.  Cardiovascular: Normal rate, regular rhythm and normal heart sounds.   Pulmonary/Chest: Effort normal. She exhibits no tenderness.       No wheezes heard in either lung. Breath movement is decreased. Coarse breath sounds heard.   Neurological: She is alert and oriented to person, place, and time.  Skin: Skin is warm and dry.  Psychiatric: She has a normal mood and affect. Her behavior is normal.          Assessment & Plan:  Pneumonia/COPD- SP02 is 91 today. Pt reports to be feeling much better. Do not want to make any changes to COPD medication since she is going to start seeing a pulmonologist. Pt was instructed to follow up in 6 months for diabetes recheck.

## 2012-03-05 ENCOUNTER — Other Ambulatory Visit: Payer: Self-pay | Admitting: Family Medicine

## 2012-03-18 ENCOUNTER — Encounter: Payer: Self-pay | Admitting: Family Medicine

## 2012-04-23 ENCOUNTER — Other Ambulatory Visit: Payer: Self-pay | Admitting: Family Medicine

## 2012-07-31 ENCOUNTER — Ambulatory Visit: Payer: Medicare Other | Admitting: Family Medicine

## 2012-07-31 DIAGNOSIS — Z0289 Encounter for other administrative examinations: Secondary | ICD-10-CM

## 2012-08-24 ENCOUNTER — Ambulatory Visit (INDEPENDENT_AMBULATORY_CARE_PROVIDER_SITE_OTHER): Payer: Medicare Other | Admitting: Family Medicine

## 2012-08-24 ENCOUNTER — Encounter: Payer: Self-pay | Admitting: Family Medicine

## 2012-08-24 VITALS — BP 162/78 | HR 119 | Ht 63.0 in | Wt 222.0 lb

## 2012-08-24 DIAGNOSIS — I10 Essential (primary) hypertension: Secondary | ICD-10-CM

## 2012-08-24 DIAGNOSIS — E119 Type 2 diabetes mellitus without complications: Secondary | ICD-10-CM

## 2012-08-24 DIAGNOSIS — F17200 Nicotine dependence, unspecified, uncomplicated: Secondary | ICD-10-CM

## 2012-08-24 DIAGNOSIS — Z720 Tobacco use: Secondary | ICD-10-CM

## 2012-08-24 DIAGNOSIS — J449 Chronic obstructive pulmonary disease, unspecified: Secondary | ICD-10-CM

## 2012-08-24 LAB — POCT UA - MICROALBUMIN: Creatinine, POC: 50 mg/dL

## 2012-08-24 MED ORDER — METFORMIN HCL 500 MG PO TABS: 500.0000 mg | ORAL_TABLET | Freq: Two times a day (BID) | ORAL | Status: DC

## 2012-08-24 MED ORDER — OXYBUTYNIN CHLORIDE 5 MG PO TABS: 5.0000 mg | ORAL_TABLET | Freq: Two times a day (BID) | ORAL | Status: DC

## 2012-08-24 MED ORDER — ALBUTEROL SULFATE HFA 108 (90 BASE) MCG/ACT IN AERS: 2.0000 | INHALATION_SPRAY | Freq: Four times a day (QID) | RESPIRATORY_TRACT | Status: DC | PRN

## 2012-08-24 MED ORDER — BUDESONIDE-FORMOTEROL FUMARATE 160-4.5 MCG/ACT IN AERO: 2.0000 | INHALATION_SPRAY | Freq: Two times a day (BID) | RESPIRATORY_TRACT | Status: DC

## 2012-08-24 NOTE — Progress Notes (Signed)
  Subjective:    Patient ID: Charlotte Brewer, female    DOB: 1941-03-25, 72 y.o.   MRN: [id number]  HPI DM-Home sugars running around 140. This is a little elevated for her. She feels like it is because she's gained some weight since quitting smoking. No cuts or wounds or sores are healing well. No hypoglycemic events. She normally is diet controlled and does not take any medications for her diabetes.   HTN -  Pt denies chest pain,, dizziness, or heart palpitations.  Taking meds as directed w/o problems.  Denies medication side effects.    COPD - has been persistantly SOB.  Sees Pulmonology.  Recently seen for bronchitis.  Put on prednisone.  Took ABX for 10 days.  Off of it for a month but stil has a horrible cough at night.  No fever.  No nightsweats.  Satarted feelin bad again aobut a 2wk after completed ABX. Out of her symbicort for a few weeks.  Out of proair. She is on her spiriva.  Her husband smokes around her.  Says cough is more dry.   Review of Systems     Objective:   Physical Exam  Constitutional: She is oriented to person, place, and time. She appears well-developed and well-nourished.  HENT:  Head: Normocephalic and atraumatic.  Right Ear: External ear normal.  Left Ear: External ear normal.  Nose: Nose normal.  Mouth/Throat: Oropharynx is clear and moist.       TMs and canals are clear. + denture  Eyes: Conjunctivae normal and EOM are normal. Pupils are equal, round, and reactive to light.  Neck: Neck supple. No thyromegaly present.  Cardiovascular: Normal rate, regular rhythm and normal heart sounds.   Pulmonary/Chest: Effort normal and breath sounds normal. She has no wheezes.  Lymphadenopathy:    She has no cervical adenopathy.  Neurological: She is alert and oriented to person, place, and time.  Skin: Skin is warm and dry.  Psychiatric: She has a normal mood and affect.          Assessment & Plan:  DM-uncontrolled. This is the highest her hemoglobin A1c has  ever been. We discussed options. I want to start on metformin 50 milligrams twice a day. Warned of potential side effects including diarrhea. It does happen she can decrease down to once a day and continue this until I see her back in 3 months. Reminded her to make sure she gets her diabetic eye exam. She is well overdue but does have financial issues that have kept her from doing this. Her monofilament exam was performed today. Urine microalbumin updated as well.  HTN- didn't take her meds this AM bc fasting for labwork.    COPD - Restart symboicort an dsee if feels better. If not will call in second round of ABX.  Also consider getting chest x-ray if not improving. Her pulmonologist had one recently as he has been following a lesion on her lungs. Evidently it was getting smaller which is very reassuring. Continue prescription for Symbicort and prior to her pharmacy.  Tob abuse - quit smoking about 7 months ago. Congratulated her on this. Unfortunately though she has gained a significant amount of weight. We discussed trying to get some regular exercise as she does have some chronic back pain issues. She is a great candidate to do silver sneakers at one of the local gym including the [facility] but actually has water aerobic programs. Encouraged her to check into this.

## 2012-08-24 NOTE — Patient Instructions (Addendum)
Try to get your eye exam for your diabetes.  Start your symbicort. If you are not feeling better in 3-4 days then call me and I will send in an antibiotic for you.  If you get loose stools or diarrhea on the metformin and decrease down to once a day in the morning.

## 2012-08-25 LAB — COMPLETE METABOLIC PANEL WITH GFR
ALT: 17 U/L (ref 0–35)
AST: 17 U/L (ref 0–37)
CO2: 25 mEq/L (ref 19–32)
Calcium: 9.3 mg/dL (ref 8.4–10.5)
Chloride: 107 mEq/L (ref 96–112)
GFR, Est African American: 66 mL/min
Sodium: 141 mEq/L (ref 135–145)
Total Bilirubin: 0.6 mg/dL (ref 0.3–1.2)
Total Protein: 6.5 g/dL (ref 6.0–8.3)

## 2012-08-25 LAB — LIPID PANEL
HDL: 51 mg/dL (ref >39)
LDL Cholesterol: 114 mg/dL — ABNORMAL HIGH (ref 0–99)
Triglycerides: 226 mg/dL — ABNORMAL HIGH (ref <150)
VLDL: 45 mg/dL — ABNORMAL HIGH (ref 0–40)

## 2012-09-07 ENCOUNTER — Telehealth: Payer: Self-pay

## 2012-09-07 MED ORDER — DIPHENHYD-HYDROCORT-NYSTATIN MT SUSP: 5.0000 mL | Freq: Three times a day (TID) | OROMUCOSAL | Status: DC

## 2012-09-07 NOTE — Telephone Encounter (Signed)
Home health called.  Charlotte Brewer is complaining of mouth pain. Home health would like her to have magic mouth wash due to the fact she has been on multiple antibiotics. Please advise.   [facility]

## 2012-09-07 NOTE — Telephone Encounter (Signed)
Faxed med to [facility]

## 2012-09-07 NOTE — Telephone Encounter (Signed)
Okay to send prescription for Magic mouthwash to pharmacy. Medication entered.

## 2012-09-16 ENCOUNTER — Encounter: Payer: Self-pay | Admitting: Family Medicine

## 2012-09-16 ENCOUNTER — Ambulatory Visit (INDEPENDENT_AMBULATORY_CARE_PROVIDER_SITE_OTHER): Payer: Medicare Other | Admitting: Family Medicine

## 2012-09-16 VITALS — BP 158/80 | HR 86 | Wt 218.0 lb

## 2012-09-16 DIAGNOSIS — E119 Type 2 diabetes mellitus without complications: Secondary | ICD-10-CM

## 2012-09-16 DIAGNOSIS — G609 Hereditary and idiopathic neuropathy, unspecified: Secondary | ICD-10-CM

## 2012-09-16 DIAGNOSIS — J449 Chronic obstructive pulmonary disease, unspecified: Secondary | ICD-10-CM

## 2012-09-16 DIAGNOSIS — G629 Polyneuropathy, unspecified: Secondary | ICD-10-CM

## 2012-09-16 DIAGNOSIS — I10 Essential (primary) hypertension: Secondary | ICD-10-CM

## 2012-09-16 DIAGNOSIS — J189 Pneumonia, unspecified organism: Secondary | ICD-10-CM

## 2012-09-16 DIAGNOSIS — E1142 Type 2 diabetes mellitus with diabetic polyneuropathy: Secondary | ICD-10-CM

## 2012-09-16 MED ORDER — DIPHENHYD-HYDROCORT-NYSTATIN MT SUSP: 5.0000 mL | Freq: Three times a day (TID) | OROMUCOSAL | Status: DC

## 2012-09-16 NOTE — Patient Instructions (Signed)
Please schedule an eye exam.   Call me with the medication that you want to try for your neuropathy

## 2012-09-16 NOTE — Progress Notes (Signed)
  Subjective:    Patient ID: Charlotte Brewer, female    DOB: [birth year], 72 y.o.   MRN: [id number]  HPI  Charlotte Brewer is here to follow up hosptial visit.Was seen at [facility].   She had pneumonia. This was the 2 episode in 7 months. Her o2 at rest is 75 %. She is on 2 liters at home. Completed zpack and levaquin and prednisone.  Completed those about 5 days ago.  Completed steroids too. She feels well.  No fever.  She says she feels completely better. No significant cough or sputum production.  Neuropathy-she has burning pain in her feet. She says it mostly occurs at night before going to bed. That is when she notices it the most. No prior history of neuropathy. She does have diabetes and has been poorly controlled for the last 6 months. If in both feet. She did have some recent swelling in both feet during her episode of pneumonia but says that has improved greatly. She did have a monofilament exam performed last month.  There is a particular medication that she would like to try determine the name of it.   Review of Systems     Objective:   Physical Exam  Constitutional: She is oriented to person, place, and time. She appears well-developed and well-nourished.  HENT:  Head: Normocephalic and atraumatic.  Neck: Neck supple. No thyromegaly present.  Cardiovascular: Normal rate, regular rhythm and normal heart sounds.   Pulmonary/Chest: Effort normal and breath sounds normal.  Course ronchi, no wheezing or crackles.    Musculoskeletal: She exhibits no edema.  Lymphadenopathy:    She has no cervical adenopathy.  Neurological: She is alert and oriented to person, place, and time.  Skin: Skin is warm and dry.  Psychiatric: She has a normal mood and affect. Her behavior is normal.          Assessment & Plan:  Pneumonia- Resolved. She says she's feeling 100% better. I encouraged her to wear her oxygen continuously. She says she doesn't wear it at night. I think is extremely important for  her during the daytime and overnight. Lung exam is back to baseline. Pulse ox is 75% without oxygen. She has all of her inhalers.  HTN- Uncontrolled today but normally well controlled.  She came in her without her oxygen on and her pulse ox was 75.  Placed oxygen on her and it went up to 86%. She has a followup in April for her diabetes and we can recheck her blood pressure at that point in time. I'm assuming her blood pressure was fairly normal at the hospital since they did not make any adjustments to her blood pressure medications. I think her blood pressure which is high from not being on her oxygen.  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 She has no actual numbness just is having burning sensation. Will check B12, folate, ferritin as well to rule out other causes. The most likely from her diabetes. I explained the importance of getting her sugars under control so that it does not progress. She says she understands and will keep her follow up in April for her diabetes. She also called the name of the medication that she is interested in trying it we can make sure that it's appropriate with her other regimen.

## 2012-09-17 ENCOUNTER — Other Ambulatory Visit: Payer: Self-pay | Admitting: Family Medicine

## 2012-09-17 DIAGNOSIS — R7989 Other specified abnormal findings of blood chemistry: Secondary | ICD-10-CM

## 2012-09-17 LAB — C-REACTIVE PROTEIN: CRP: 1.4 mg/dL — ABNORMAL HIGH (ref <0.60)

## 2012-09-17 LAB — FERRITIN: Ferritin: 778 ng/mL — ABNORMAL HIGH (ref 10–291)

## 2012-09-17 LAB — HEPATIC FUNCTION PANEL
ALT: 15 U/L (ref 0–35)
Bilirubin, Direct: 0.3 mg/dL (ref 0.0–0.3)
Total Protein: 6 g/dL (ref 6.0–8.3)

## 2012-09-23 ENCOUNTER — Other Ambulatory Visit: Payer: Self-pay | Admitting: Family Medicine

## 2012-09-24 LAB — HEMOCHROMATOSIS DNA-PCR(C282Y,H63D): DNA Mutation Analysis: NOT DETECTED

## 2012-10-29 ENCOUNTER — Other Ambulatory Visit: Payer: Self-pay | Admitting: Family Medicine

## 2012-11-24 ENCOUNTER — Ambulatory Visit: Payer: Medicare Other | Admitting: Family Medicine

## 2012-11-24 DIAGNOSIS — Z0289 Encounter for other administrative examinations: Secondary | ICD-10-CM

## 2012-12-09 ENCOUNTER — Telehealth: Payer: Self-pay | Admitting: *Deleted

## 2012-12-09 NOTE — Telephone Encounter (Signed)
Called pt to inform her that we received a letter from her [employer] informing us that her inhaler will have to be switched from Ventolin to [employer]. Pt stated that she received the letter also.Charlotte Brewer

## 2012-12-29 ENCOUNTER — Telehealth: Payer: Self-pay | Admitting: Family Medicine

## 2012-12-29 NOTE — Telephone Encounter (Signed)
Call pt: Needs diabetes appt. Also needs diabetic eye exam.

## 2012-12-29 NOTE — Telephone Encounter (Signed)
appt made for 6.4.2014 @ 945 am.Myking Sar, [place]

## 2012-12-30 ENCOUNTER — Telehealth: Payer: Self-pay | Admitting: Family Medicine

## 2012-12-30 ENCOUNTER — Ambulatory Visit (INDEPENDENT_AMBULATORY_CARE_PROVIDER_SITE_OTHER): Payer: Medicare Other | Admitting: Family Medicine

## 2012-12-30 ENCOUNTER — Encounter: Payer: Self-pay | Admitting: Family Medicine

## 2012-12-30 VITALS — BP 165/74 | HR 69

## 2012-12-30 DIAGNOSIS — I10 Essential (primary) hypertension: Secondary | ICD-10-CM

## 2012-12-30 DIAGNOSIS — J449 Chronic obstructive pulmonary disease, unspecified: Secondary | ICD-10-CM

## 2012-12-30 DIAGNOSIS — E119 Type 2 diabetes mellitus without complications: Secondary | ICD-10-CM

## 2012-12-30 DIAGNOSIS — J441 Chronic obstructive pulmonary disease with (acute) exacerbation: Secondary | ICD-10-CM

## 2012-12-30 LAB — POCT GLYCOSYLATED HEMOGLOBIN (HGB A1C): Hemoglobin A1C: 6.5

## 2012-12-30 MED ORDER — IPRATROPIUM BROMIDE 0.02 % IN SOLN: 0.5000 mg | Freq: Once | RESPIRATORY_TRACT | Status: DC

## 2012-12-30 MED ORDER — PREDNISONE 20 MG PO TABS: ORAL_TABLET | ORAL | Status: DC

## 2012-12-30 MED ORDER — METFORMIN HCL 500 MG PO TABS: 500.0000 mg | ORAL_TABLET | Freq: Two times a day (BID) | ORAL | Status: DC

## 2012-12-30 MED ORDER — ALBUTEROL SULFATE (2.5 MG/3ML) 0.083% IN NEBU: 2.5000 mg | INHALATION_SOLUTION | Freq: Once | RESPIRATORY_TRACT | Status: DC

## 2012-12-30 MED ORDER — METOPROLOL TARTRATE 100 MG PO TABS: ORAL_TABLET | ORAL | Status: DC

## 2012-12-30 MED ORDER — METHYLPREDNISOLONE SODIUM SUCC 125 MG IJ SOLR: 125.0000 mg | Freq: Once | INTRAMUSCULAR | Status: DC

## 2012-12-30 NOTE — Telephone Encounter (Signed)
Called pt and she has a eye doctor that she sees however she cannot afford at this time.Loralee Pacas Ricardo

## 2012-12-30 NOTE — Progress Notes (Signed)
Subjective:    Patient ID: Charlotte Brewer, female    DOB: 09-Nov-1940, 72 y.o.   MRN: [id number]  HPI COPD - She is using her symbicort daily.Marland Kitchen  HAs been more SOB x 2 weeks with slight inc cough. No change in sputum. Says pollen is aggrevating her sxs.  Using albuterol 1-2 times a day. No fever or URI sxs. On spiriva.  On  2 liters right now at home, continuous. No productive sputum. No significant cold-type symptoms but she has had a little bit of runny nose and some nasal congestion with her allergies.  Tob abuse - hasn't smoked in a year.  Still around second hand smoke.   DM- sugars running in the 130. No inc thirst or urination. No hypoglycemic events.  She reports taking her medications regularly without any side effects or problems. Lab Results  Component Value Date   HGBA1C 7.8 08/24/2012   HTN-  Pt denies chest pain dizziness, or heart palpitations.  Taking meds as directed w/o problems.  Denies medication side effects.  + SOB  Review of Systems BP 165/74  Pulse 69  SpO2 84%    No Known Allergies  Past Medical History  Diagnosis Date  . Pulmonary scarring   . Eczema     692.9  . Thrombocytopenia   . Back pain   . Disc disease, degenerative, lumbar or lumbosacral     Past Surgical History  Procedure Laterality Date  . Tubal ligation  1977  . Abdominal hysterectomy  1984  . Bladder tac 1990  1990  . Cholecystectomy  2007  . Back surgery  03-29-2009    Dr. Shon Baton    History   Social History  . Marital Status: Married    Spouse Name: N/A    Number of Children: N/A  . Years of Education: N/A   Occupational History  . Not on file.   Social History Main Topics  . Smoking status: Former Smoker -- 1.00 packs/day    Types: Cigarettes  . Smokeless tobacco: Not on file     Comment: She says she is working on weaning down.   . Alcohol Use: No  . Drug Use: No  . Sexually Active: Not on file     Comment: homemaker, hs degree, married, 3 children, no regular  exercise.   Other Topics Concern  . Not on file   Social History Narrative  . No narrative on file    Family History  Problem Relation Age of Onset  . Heart disease Father   . Diabetes Sister   . Diabetes Brother     Outpatient Encounter Prescriptions as of 12/30/2012  Medication Sig Dispense Refill  . albuterol (PROVENTIL HFA;VENTOLIN HFA) 108 (90 BASE) MCG/ACT inhaler Inhale 2 puffs into the lungs every 6 (six) hours as needed for wheezing.  1 Inhaler  2  . aspirin 81 MG chewable tablet Chew 81 mg by mouth daily.        . budesonide-formoterol (SYMBICORT) 160-4.5 MCG/ACT inhaler Inhale 2 puffs into the lungs 2 (two) times daily.  1 Inhaler  12  . Diphenhyd-Hydrocort-Nystatin SUSP Use as directed 5 mLs in the mouth or throat 3 (three) times daily. dont swallow  150 mL  0  . glucose blood (TRUETRACK TEST) test strip        . lisinopril (PRINIVIL,ZESTRIL) 40 MG tablet Take 1 tablet (40 mg total) by mouth daily.  30 tablet  3  . metFORMIN (GLUCOPHAGE) 500 MG tablet Take 1  tablet (500 mg total) by mouth 2 (two) times daily with a meal. Generic please  60 tablet  6  . metoprolol (LOPRESSOR) 100 MG tablet Take 1 tablet (100 mg total) by mouth 2 (two) times daily.  60 tablet  6  . Omega-3 Fatty Acids (FISH OIL) 1000 MG CPDR Take 1,000 mg by mouth daily.        Marland Kitchen oxybutynin (DITROPAN) 5 MG tablet Take 1 tablet (5 mg total) by mouth 2 (two) times daily.  60 tablet  6  . predniSONE (DELTASONE) 20 MG tablet 40mg  QD x 5 days then 20mg  QD x 5 days.  15 tablet  0  . PROAIR HFA 108 (90 BASE) MCG/ACT inhaler INHALE 2-4 PUFFS EVERY 4-6 HOURS AS NEEDED FOR WHEEZING  8.5 each  2  . simvastatin (ZOCOR) 40 MG tablet TAKE 1 TABLET AT BEDTIME.  30 tablet  3  . tiotropium (SPIRIVA) 18 MCG inhalation capsule Place 1 capsule (18 mcg total) into inhaler and inhale daily.  30 capsule  3  . [DISCONTINUED] metFORMIN (GLUCOPHAGE) 500 MG tablet Take 1 tablet (500 mg total) by mouth 2 (two) times daily with a meal.  Generic please  60 tablet  3  . [DISCONTINUED] metoprolol (LOPRESSOR) 100 MG tablet Take 1 tablet (100 mg total) by mouth 2 (two) times daily.  60 tablet  2   Facility-Administered Encounter Medications as of 12/30/2012  Medication Dose Route Frequency Provider Last Rate Last Dose  . albuterol (PROVENTIL) (2.5 MG/3ML) 0.083% nebulizer solution 2.5 mg  2.5 mg Nebulization Once Agapito Games, MD      . ipratropium (ATROVENT) nebulizer solution 0.5 mg  0.5 mg Nebulization Once Agapito Games, MD      . methylPREDNISolone sodium succinate (SOLU-MEDROL) 125 mg/2 mL injection 125 mg  125 mg Intramuscular Once Agapito Games, MD              Objective:   Physical Exam  Constitutional: She is oriented to person, place, and time. She appears well-developed and well-nourished.  HENT:  Head: Normocephalic and atraumatic.  Right Ear: External ear normal.  Left Ear: External ear normal.  Nose: Nose normal.  Mouth/Throat: Oropharynx is clear and moist.  TMs and canals are clear.   Eyes: Conjunctivae and EOM are normal. Pupils are equal, round, and reactive to light.  Neck: Neck supple. No thyromegaly present.  Cardiovascular: Normal rate, regular rhythm and normal heart sounds.   Pulmonary/Chest: Effort normal and breath sounds normal. She has no wheezes.  Lymphadenopathy:    She has no cervical adenopathy.  Neurological: She is alert and oriented to person, place, and time.  Skin: Skin is warm and dry.  Psychiatric: She has a normal mood and affect.          Assessment & Plan:  COPD -  Exacerbation-no signs or symptom of actual infection I just think it's allergies and increased heat and humidity that have caused her exacerbation. We'll treat with oral prednisone. Did hold off on prescribing antibiotics and she has not had any change in sputum or fever to suggest infection.. Followup in one week to make sure that she is improving. Generously use albuterol every 4-6 hours  as needed and continue Symbicort and Spiriva. She was given a duo neb treatment here in the office and responded well. Also given IM Solu-Medrol 125 mg. We'll set of her prescription for oral prednisone to taper over 10 days. I would like to see her back in  one week to make sure that she is improving.  Tob abuse - congratulated her on cessation for the last year.  Unfortunately, she still being exposed to secondhand smoke. She says she can see this might actually coming through the vents in the house.  DM- well-controlled. Hemoglobin A1c looks fantastic today. Continue current regimen. F/U in 3 months.  Reminded her to get her eye exam.  Lab Results  Component Value Date   HGBA1C 6.5 12/30/2012   Hypertension-uncontrolled today but she is not feeling well. I would like to recheck this when I see her back in one week.

## 2012-12-30 NOTE — Telephone Encounter (Signed)
Please call patient: She is overdue for her diabetic eye exam. Would she like [place] to make a referral for her and have their office call her. We can set her up here in [place].

## 2013-01-08 ENCOUNTER — Ambulatory Visit (INDEPENDENT_AMBULATORY_CARE_PROVIDER_SITE_OTHER): Payer: Medicare Other | Admitting: Family Medicine

## 2013-01-08 ENCOUNTER — Encounter: Payer: Self-pay | Admitting: Family Medicine

## 2013-01-08 VITALS — BP 185/82 | HR 88 | Wt 222.0 lb

## 2013-01-08 DIAGNOSIS — J449 Chronic obstructive pulmonary disease, unspecified: Secondary | ICD-10-CM

## 2013-01-08 DIAGNOSIS — J019 Acute sinusitis, unspecified: Secondary | ICD-10-CM

## 2013-01-08 MED ORDER — DOXYCYCLINE HYCLATE 100 MG PO TABS: 100.0000 mg | ORAL_TABLET | Freq: Two times a day (BID) | ORAL | Status: DC

## 2013-01-08 NOTE — Progress Notes (Signed)
  Subjective:    Patient ID: Charlotte Brewer, female    DOB: November 17, 1940, 72 y.o.   MRN: [id number]  HPI COPD- Doing well on prednisone. ON oxygen 24 hours a day.  Says she is is finally used to wearing it.  No chills, sweats or fever.  She feels better on the steroid. Says nose has been really congested for several days.  as well. No productive sputum   No HA. Rushed out of house to get here so camed without her oxygen and she was at 79% sat when got here. We put 2 L on her during the OV.      Review of Systems     Objective:   Physical Exam  Constitutional: She is oriented to person, place, and time. She appears well-developed and well-nourished.  HENT:  Head: Normocephalic and atraumatic.  Cardiovascular: Normal rate, regular rhythm and normal heart sounds.   Pulmonary/Chest: Effort normal and breath sounds normal.  Mild expiratory wheezing  Neurological: She is alert and oriented to person, place, and time.  Skin: Skin is warm and dry.  Psychiatric: She has a normal mood and affect. Her behavior is normal.          Assessment & Plan:  COPD - Under fair control. Feeling much better with steroids . jusst saw her pulmonologist today. Samples of spirival and symbicort given.     Sinusitis- Will tx sith doxy.  Will cover the lungs as well. Call if nto better in one week.   DM- Reminded her to f/U in sept

## 2013-01-13 ENCOUNTER — Encounter: Payer: Self-pay | Admitting: Family Medicine

## 2013-02-17 ENCOUNTER — Other Ambulatory Visit: Payer: Self-pay | Admitting: Family Medicine

## 2013-04-09 ENCOUNTER — Ambulatory Visit: Payer: Medicare Other | Admitting: Family Medicine

## 2013-04-12 ENCOUNTER — Ambulatory Visit: Payer: Medicare Other | Admitting: Family Medicine

## 2013-04-13 ENCOUNTER — Encounter: Payer: Self-pay | Admitting: Family Medicine

## 2013-04-13 ENCOUNTER — Ambulatory Visit (INDEPENDENT_AMBULATORY_CARE_PROVIDER_SITE_OTHER): Payer: Medicare Other | Admitting: Family Medicine

## 2013-04-13 VITALS — BP 150/72 | HR 69 | Wt 229.0 lb

## 2013-04-13 DIAGNOSIS — M7989 Other specified soft tissue disorders: Secondary | ICD-10-CM

## 2013-04-13 DIAGNOSIS — J441 Chronic obstructive pulmonary disease with (acute) exacerbation: Secondary | ICD-10-CM

## 2013-04-13 DIAGNOSIS — E119 Type 2 diabetes mellitus without complications: Secondary | ICD-10-CM

## 2013-04-13 DIAGNOSIS — I10 Essential (primary) hypertension: Secondary | ICD-10-CM

## 2013-04-13 MED ORDER — LISINOPRIL 40 MG PO TABS: 40.0000 mg | ORAL_TABLET | Freq: Every day | ORAL | Status: DC

## 2013-04-13 MED ORDER — LISINOPRIL-HYDROCHLOROTHIAZIDE 20-12.5 MG PO TABS: 1.0000 | ORAL_TABLET | Freq: Every day | ORAL | Status: AC

## 2013-04-13 NOTE — Progress Notes (Signed)
  Subjective:    Patient ID: Charlotte Brewer, female    DOB: [birth year], 72 y.o.   MRN: [id number]  HPI Here for Diabetic f/u today - no hypoglycemic events.  No wounds that aren't hearing well. She says her home numbers have actually been doing great. She's not had any concerns. She's taking her medications as prescribed.  COPD - Was hospitalized last week and says was put on oxygen.  Says not on any ABX or steroid. Says her breathing about the same. On symbicort. Quit smoking 15 mo ago. Weraing 2 liters. Says Dr. Jerre Simon, her pulmonologist, was giving her samples of Breo. He says she really likes it compared to the Symbicort.  HTN-  Pt denies chest pain, SOB, dizziness, or heart palpitations.  Taking meds as directed w/o problems.  Denies medication side effects.  5 min spent with pt. Says LE swelling comes and goes.  Better in the AM and bette rwith elevateion.  She's not currently taking her lisinopril. She says she's not sure who stopped it but was stopped.   Review of Systems     Objective:   Physical Exam  Constitutional: She is oriented to person, place, and time. She appears well-developed and well-nourished.  HENT:  Head: Normocephalic and atraumatic.  Cardiovascular: Normal rate, regular rhythm and normal heart sounds.   Pulmonary/Chest: Effort normal and breath sounds normal.  Neurological: She is alert and oriented to person, place, and time.  Skin: Skin is warm and dry.  Psychiatric: She has a normal mood and affect. Her behavior is normal.          Assessment & Plan:  DM - well controlled on current regimen. Hemoglobin A1c is 6.7 today. Flu vaccine given today. She is off her lisinopril.  Will restart lisinopril. Last kidney function was stable for her. We'll need to recheck in 1-2 weeks after restart the lisinopril. She is currently taking a statin.    COPD - Stable.  She feels like she is back to her baseline. Breo samples given today.  Keep followup with Dr.  Jerre Simon  HTN- uncontrolled.  I would like to restart her lisinopril with a diuretic. If this will help with her blood pressure and is indicated because of her diabetes. The addition of the diuretic might help her lower extremity swelling. It may or may not be powerful enough so we may need to consider switching to Lasix. But we'll start with the hydrochlorothiazide and recheck her kidney function in about one to 2 weeks. Lab slip given today.

## 2013-05-04 ENCOUNTER — Ambulatory Visit: Payer: Medicare Other | Admitting: Family Medicine

## 2013-05-20 ENCOUNTER — Ambulatory Visit (INDEPENDENT_AMBULATORY_CARE_PROVIDER_SITE_OTHER): Payer: Medicare Other | Admitting: Family Medicine

## 2013-05-20 ENCOUNTER — Encounter: Payer: Self-pay | Admitting: Family Medicine

## 2013-05-20 VITALS — BP 119/65 | HR 70 | Wt 228.0 lb

## 2013-05-20 DIAGNOSIS — E119 Type 2 diabetes mellitus without complications: Secondary | ICD-10-CM

## 2013-05-20 DIAGNOSIS — J441 Chronic obstructive pulmonary disease with (acute) exacerbation: Secondary | ICD-10-CM

## 2013-05-20 DIAGNOSIS — M7989 Other specified soft tissue disorders: Secondary | ICD-10-CM

## 2013-05-20 DIAGNOSIS — I10 Essential (primary) hypertension: Secondary | ICD-10-CM

## 2013-05-20 LAB — POCT GLYCOSYLATED HEMOGLOBIN (HGB A1C): Hemoglobin A1C: 6.8

## 2013-05-20 MED ORDER — SIMVASTATIN 40 MG PO TABS: ORAL_TABLET | ORAL | Status: AC

## 2013-05-20 MED ORDER — OXYBUTYNIN CHLORIDE 5 MG PO TABS: 5.0000 mg | ORAL_TABLET | Freq: Two times a day (BID) | ORAL | Status: AC

## 2013-05-20 MED ORDER — ALBUTEROL SULFATE (2.5 MG/3ML) 0.083% IN NEBU
2.5000 mg | INHALATION_SOLUTION | Freq: Once | RESPIRATORY_TRACT | Status: AC
  Administered 2013-05-20: 2.5 mg via RESPIRATORY_TRACT

## 2013-05-20 NOTE — Progress Notes (Signed)
Subjective:    Patient ID: Charlotte Brewer, female    DOB: July 11, 1941, 72 y.o.   MRN: [id number]  HPI COPD- has been wheezing. Has been haivng problems with her oxygen. [facility] is coming out today to check her oxygen. Out of her oxygen for a few days.  No URI sxs. No fever, chills, or sweats.  Fall allergies.    DM- no hypoglycemic events. No wounds or sores that are not healing well. On metformin.Sugars are well controlled at home.  No regular exercise. Lab Results  Component Value Date   HGBA1C 6.5 12/30/2012   Leg swelling.  - Legs are much better on the hctz. She feels like it has helped and made a difference. They are still swollen but she feels like it's more manageable now. She does not wear compression stockings.  HTN-  Pt denies chest pain, SOB, dizziness, or heart palpitations.  Taking meds as directed w/o problems.  Denies medication side effects.  Review of Systems  BP 119/65  Pulse 70  Wt 228 lb (103.42 kg)  BMI 40.4 kg/m2  SpO2 84%    No Known Allergies  Past Medical History  Diagnosis Date  . Pulmonary scarring   . Eczema     692.9  . Thrombocytopenia   . Back pain   . Disc disease, degenerative, lumbar or lumbosacral     Past Surgical History  Procedure Laterality Date  . Tubal ligation  1977  . Abdominal hysterectomy  1984  . Bladder tac 1990  1990  . Cholecystectomy  2007  . Back surgery  03-29-2009    Dr. Shon Baton    History   Social History  . Marital Status: Married    Spouse Name: N/A    Number of Children: N/A  . Years of Education: N/A   Occupational History  . Not on file.   Social History Main Topics  . Smoking status: Former Smoker -- 1.00 packs/day    Types: Cigarettes  . Smokeless tobacco: Not on file     Comment: She says she is working on weaning down.   . Alcohol Use: No  . Drug Use: No  . Sexual Activity: Not on file     Comment: homemaker, hs degree, married, 3 children, no regular exercise.   Other Topics  Concern  . Not on file   Social History Narrative  . No narrative on file    Family History  Problem Relation Age of Onset  . Heart disease Father   . Diabetes Sister   . Diabetes Brother     Outpatient Encounter Prescriptions as of 05/20/2013  Medication Sig Dispense Refill  . albuterol (PROVENTIL HFA;VENTOLIN HFA) 108 (90 BASE) MCG/ACT inhaler Inhale 2 puffs into the lungs every 6 (six) hours as needed for wheezing.  1 Inhaler  2  . Alcohol Swabs (ALCOHOL PREP) 70 % PADS       . AMBULATORY NON FORMULARY MEDICATION       . AMBULATORY NON FORMULARY MEDICATION       . AMBULATORY NON FORMULARY MEDICATION       . AMBULATORY NON FORMULARY MEDICATION       . aspirin 81 MG chewable tablet Chew 81 mg by mouth daily.        . budesonide-formoterol (SYMBICORT) 160-4.5 MCG/ACT inhaler Inhale 2 puffs into the lungs 2 (two) times daily.  1 Inhaler  12  . glucose blood (TRUETRACK TEST) test strip        .  lisinopril-hydrochlorothiazide (PRINZIDE,ZESTORETIC) 20-12.5 MG per tablet Take 1 tablet by mouth daily.  60 tablet  4  . metFORMIN (GLUCOPHAGE) 500 MG tablet Take 1 tablet (500 mg total) by mouth 2 (two) times daily with a meal. Generic please  60 tablet  6  . metoprolol (LOPRESSOR) 100 MG tablet Take 1 tablet (100 mg total) by mouth 2 (two) times daily.  60 tablet  6  . Omega-3 Fatty Acids (FISH OIL) 1000 MG CPDR Take 1,000 mg by mouth daily.        Marland Kitchen oxybutynin (DITROPAN) 5 MG tablet Take 1 tablet (5 mg total) by mouth 2 (two) times daily.  60 tablet  11  . PROAIR HFA 108 (90 BASE) MCG/ACT inhaler INHALE 2-4 PUFFS EVERY 4-6 HOURS AS NEEDED FOR WHEEZING  8.5 each  2  . simvastatin (ZOCOR) 40 MG tablet TAKE 1 TABLET AT BEDTIME.  90 tablet  3  . tiotropium (SPIRIVA) 18 MCG inhalation capsule Place 1 capsule (18 mcg total) into inhaler and inhale daily.  30 capsule  3  . [DISCONTINUED] oxybutynin (DITROPAN) 5 MG tablet Take 1 tablet (5 mg total) by mouth 2 (two) times daily.  60 tablet  6  .  [DISCONTINUED] simvastatin (ZOCOR) 40 MG tablet TAKE 1 TABLET AT BEDTIME.  30 tablet  0  . albuterol (PROVENTIL) (2.5 MG/3ML) 0.083% nebulizer solution 2.5 mg        No facility-administered encounter medications on file as of 05/20/2013.          Objective:   Physical Exam  Constitutional: She is oriented to person, place, and time. She appears well-developed and well-nourished.  HENT:  Head: Normocephalic and atraumatic.  Cardiovascular: Normal rate, regular rhythm and normal heart sounds.   Pulmonary/Chest: Effort normal and breath sounds normal.  Musculoskeletal: She exhibits edema.  1+ pitting edema in both legs to the knee.   Neurological: She is alert and oriented to person, place, and time.  Skin: Skin is warm and dry.  Psychiatric: She has a normal mood and affect. Her behavior is normal.          Assessment & Plan:  COPD exacerbation-given neb treatment here in the office. [facility] coming out of check her oxygen today.  Diabetes-well controlled. She's on ACE inhibitor, statin, and baby ASA. Marland Kitchendu e for [employer]. F/U in 3 months.  Lab Results  Component Value Date   HGBA1C 6.8 05/20/2013   Leg swelling-improved on hctz, still some pitting edema. For now would prefer to continue Hydrocort Dyazide instead of using a stronger diuretic. We discussed continue to watch salt intake and can certainly use compression stockings. Though she likely will not.   HTN- well controlled. Blood pressure looks much much better after adding hydrochlorothiazide. We just need to keep an eye on her renal function and potassium.

## 2013-05-21 LAB — COMPLETE METABOLIC PANEL WITH GFR
Alkaline Phosphatase: 57 U/L (ref 39–117)
CO2: 25 mEq/L (ref 19–32)
Creat: 1.24 mg/dL — ABNORMAL HIGH (ref 0.50–1.10)
GFR, Est African American: 51 mL/min — ABNORMAL LOW
GFR, Est Non African American: 44 mL/min — ABNORMAL LOW
Glucose, Bld: 117 mg/dL — ABNORMAL HIGH (ref 70–99)
Sodium: 138 mEq/L (ref 135–145)
Total Bilirubin: 0.5 mg/dL (ref 0.3–1.2)
Total Protein: 6.7 g/dL (ref 6.0–8.3)

## 2013-05-28 LAB — HEMOGLOBIN A1C: Hgb A1c MFr Bld: 7.2 % — AB (ref 4.0–6.0)

## 2013-06-08 ENCOUNTER — Ambulatory Visit: Payer: Medicare Other | Admitting: Family Medicine

## 2013-08-09 ENCOUNTER — Encounter: Payer: Self-pay | Admitting: *Deleted

## 2013-08-10 ENCOUNTER — Encounter: Payer: Self-pay | Admitting: Family Medicine

## 2013-08-10 ENCOUNTER — Ambulatory Visit (INDEPENDENT_AMBULATORY_CARE_PROVIDER_SITE_OTHER): Payer: Medicare Other | Admitting: Family Medicine

## 2013-08-10 VITALS — BP 107/55 | HR 68 | Wt 218.0 lb

## 2013-08-10 DIAGNOSIS — J441 Chronic obstructive pulmonary disease with (acute) exacerbation: Secondary | ICD-10-CM

## 2013-08-10 DIAGNOSIS — Z23 Encounter for immunization: Secondary | ICD-10-CM

## 2013-08-10 DIAGNOSIS — E119 Type 2 diabetes mellitus without complications: Secondary | ICD-10-CM

## 2013-08-10 DIAGNOSIS — I10 Essential (primary) hypertension: Secondary | ICD-10-CM

## 2013-08-10 DIAGNOSIS — Z1231 Encounter for screening mammogram for malignant neoplasm of breast: Secondary | ICD-10-CM

## 2013-08-10 LAB — POCT GLYCOSYLATED HEMOGLOBIN (HGB A1C): Hemoglobin A1C: 6.2

## 2013-08-10 MED ORDER — DOXYCYCLINE HYCLATE 100 MG PO TABS: 100.0000 mg | ORAL_TABLET | Freq: Two times a day (BID) | ORAL | Status: AC

## 2013-08-10 MED ORDER — PREDNISONE 20 MG PO TABS: 40.0000 mg | ORAL_TABLET | Freq: Every day | ORAL | Status: AC

## 2013-08-10 NOTE — Addendum Note (Signed)
Addended by: Chalmers CaterUTTLE, Kariana Wiles H on: 08/10/2013 02:11 PM   Modules accepted: Orders

## 2013-08-10 NOTE — Progress Notes (Signed)
   Subjective:    Patient ID: Charlotte Brewer, female    DOB: 07-25-1941, 73 y.o.   MRN: [id number]  HPI Diabetes - no hypoglycemic events. No wounds or sores that are not healing well. No increased thirst or urination. Checking glucose at home. Taking medications as prescribed without any side effects.Hgb A1 c today, foot exam today, unable to collect urine for micro albumin, eye exam 08/24/2012 she states she cannot afford to go to get her eye exam   Cough off and on since Thanksgiving.  Some SOb. No colored sputum. No fever. occ chills and sweats.  + nasal congestion. No ear pain or ST. Worse at night.  Her pulmonologist is continue the Symbicort and put her on 30 tablet instead. She was unable to get her rescue albuterol inhaler filled because of cost.  Hypertension- Pt denies chest pain, SOB, dizziness, or heart palpitations.  Taking meds as directed w/o problems.  Denies medication side effects.      Review of Systems     Objective:   Physical Exam  Constitutional: She is oriented to person, place, and time. She appears well-developed and well-nourished.  HENT:  Head: Normocephalic and atraumatic.  Right Ear: External ear normal.  Left Ear: External ear normal.  Nose: Nose normal.  Mouth/Throat: Oropharynx is clear and moist.  TMs and canals are clear.   Eyes: Conjunctivae and EOM are normal. Pupils are equal, round, and reactive to light.  Neck: Neck supple. No thyromegaly present.  Cardiovascular: Normal rate, regular rhythm and normal heart sounds.   Pulmonary/Chest: Effort normal and breath sounds normal. She has no wheezes.  Lymphadenopathy:    She has no cervical adenopathy.  Neurological: She is alert and oriented to person, place, and time.  Skin: Skin is warm and dry.  Psychiatric: She has a normal mood and affect.          Assessment & Plan:  DM- uncontrolled. Diabetes looks fantastic. Monofilament exam performed today. She was unable to give a urine sample for  urine microalbumin. We'll check at followup visit. Reminded her to try to get eye exam when she is able to. N/A financially she is struggling right now. Followup in 3-4 months. Lab Results  Component Value Date   HGBA1C 6.2 08/10/2013    COPD exacerabation - will treat with doxycycline for 10 days as well as a prednisone burst for 5 days. Call if not feeling somewhat better within a week.  Hypertension-well-controlled. Continue current regimen.  Due Tdap today.  Given today.  Due for mammogram. Order placed. We'll contact her schedule.

## 2013-08-17 ENCOUNTER — Ambulatory Visit: Payer: Medicare Other | Admitting: Family Medicine

## 2013-08-24 ENCOUNTER — Other Ambulatory Visit: Payer: Self-pay | Admitting: *Deleted

## 2013-08-24 MED ORDER — METOPROLOL TARTRATE 100 MG PO TABS: ORAL_TABLET | ORAL | Status: AC

## 2013-08-24 MED ORDER — ALBUTEROL SULFATE HFA 108 (90 BASE) MCG/ACT IN AERS: 2.0000 | INHALATION_SPRAY | Freq: Four times a day (QID) | RESPIRATORY_TRACT | Status: AC | PRN

## 2013-08-24 MED ORDER — METFORMIN HCL 500 MG PO TABS: 500.0000 mg | ORAL_TABLET | Freq: Two times a day (BID) | ORAL | Status: AC

## 2013-12-24 ENCOUNTER — Telehealth: Payer: Self-pay | Admitting: *Deleted

## 2013-12-24 NOTE — Telephone Encounter (Signed)
Dr. Zachery Dauer called asking if Dr. Linford Arnold would sign Death Cert for pt. She collapsed around 1030 this morning and was able to be brought back through ems before arrival to ED. Pt then had 2 more episodes and decided not to continue any more rescue attempts. Dr. Zachery Dauer feels that death was due to PE and Asystole. I told him that Dr. Linford Arnold would be ok with signing the death certificate.Deno Etienne

## 2013-12-24 NOTE — Telephone Encounter (Signed)
Agree  Thank you

## 2013-12-27 DEATH — deceased
